# Patient Record
Sex: Female | Born: 1985 | Race: White | Hispanic: No | Marital: Married | Smoking: Never smoker
Health system: Southern US, Community
[De-identification: ages and names within clinical notes are randomized; demographics above are authoritative.]

## PROBLEM LIST (undated history)

## (undated) ENCOUNTER — Inpatient Hospital Stay: Payer: Self-pay

## (undated) DIAGNOSIS — R238 Other skin changes: Secondary | ICD-10-CM

## (undated) DIAGNOSIS — E669 Obesity, unspecified: Secondary | ICD-10-CM

## (undated) DIAGNOSIS — E079 Disorder of thyroid, unspecified: Secondary | ICD-10-CM

## (undated) DIAGNOSIS — R233 Spontaneous ecchymoses: Secondary | ICD-10-CM

## (undated) DIAGNOSIS — D649 Anemia, unspecified: Secondary | ICD-10-CM

## (undated) HISTORY — PX: OTHER SURGICAL HISTORY: SHX169

## (undated) HISTORY — DX: Obesity, unspecified: E66.9

## (undated) HISTORY — DX: Anemia, unspecified: D64.9

## (undated) HISTORY — DX: Disorder of thyroid, unspecified: E07.9

## (undated) HISTORY — DX: Spontaneous ecchymoses: R23.3

## (undated) HISTORY — DX: Other skin changes: R23.8

---

## 2006-04-02 ENCOUNTER — Ambulatory Visit: Payer: Self-pay | Admitting: Family Medicine

## 2010-02-04 ENCOUNTER — Observation Stay: Payer: Self-pay | Admitting: Obstetrics & Gynecology

## 2010-02-22 ENCOUNTER — Observation Stay: Payer: Self-pay | Admitting: Unknown Physician Specialty

## 2010-02-24 ENCOUNTER — Observation Stay: Payer: Self-pay | Admitting: Unknown Physician Specialty

## 2010-03-03 ENCOUNTER — Observation Stay: Payer: Self-pay | Admitting: Obstetrics & Gynecology

## 2010-03-12 ENCOUNTER — Inpatient Hospital Stay: Payer: Self-pay | Admitting: Obstetrics and Gynecology

## 2010-09-23 ENCOUNTER — Ambulatory Visit: Payer: Self-pay | Admitting: Family Medicine

## 2011-12-29 ENCOUNTER — Inpatient Hospital Stay: Payer: Self-pay | Admitting: Internal Medicine

## 2011-12-29 LAB — CBC WITH DIFFERENTIAL/PLATELET
Basophil %: 0.2 %
Eosinophil #: 0 10*3/uL (ref 0.0–0.7)
HCT: 32.2 % — ABNORMAL LOW (ref 35.0–47.0)
Lymphocyte #: 1.2 10*3/uL (ref 1.0–3.6)
Lymphocyte %: 11.9 %
MCV: 83 fL (ref 80–100)
Monocyte #: 0.5 x10 3/mm (ref 0.2–0.9)
Monocyte %: 4.8 %
Neutrophil %: 82.9 %
Platelet: 236 10*3/uL (ref 150–440)
RDW: 14.5 % (ref 11.5–14.5)
WBC: 10.5 10*3/uL (ref 3.6–11.0)

## 2012-10-27 ENCOUNTER — Other Ambulatory Visit: Payer: Self-pay | Admitting: General Surgery

## 2012-10-27 ENCOUNTER — Encounter: Payer: Self-pay | Admitting: General Surgery

## 2012-10-27 ENCOUNTER — Ambulatory Visit (INDEPENDENT_AMBULATORY_CARE_PROVIDER_SITE_OTHER): Payer: Managed Care, Other (non HMO) | Admitting: General Surgery

## 2012-10-27 VITALS — BP 120/74 | HR 82 | Resp 12 | Ht 66.0 in | Wt 232.0 lb

## 2012-10-27 DIAGNOSIS — R1013 Epigastric pain: Secondary | ICD-10-CM

## 2012-10-27 LAB — HEPATIC FUNCTION PANEL A (ARMC)
Albumin: 3.7 g/dL (ref 3.4–5.0)
Alkaline Phosphatase: 92 U/L (ref 50–136)
Bilirubin, Direct: <0.1 mg/dL (ref 0.00–0.20)
Bilirubin,Total: 0.3 mg/dL (ref 0.2–1.0)
SGOT(AST): 15 U/L (ref 15–37)
SGPT (ALT): 24 U/L (ref 12–78)
Total Protein: 7.6 g/dL (ref 6.4–8.2)

## 2012-10-27 LAB — CBC WITH DIFFERENTIAL/PLATELET
Basophil #: 0 10*3/uL (ref 0.0–0.1)
Eosinophil #: 0.1 10*3/uL (ref 0.0–0.7)
Eosinophil %: 1.3 %
HCT: 38.2 % (ref 35.0–47.0)
Lymphocyte %: 15.5 %
MCV: 84 fL (ref 80–100)
Monocyte %: 6 %
Neutrophil #: 5 10*3/uL (ref 1.4–6.5)
Neutrophil %: 76.7 %
Platelet: 291 10*3/uL (ref 150–440)
RBC: 4.57 10*6/uL (ref 3.80–5.20)

## 2012-10-27 NOTE — Progress Notes (Signed)
Patient ID: Tiffany Bell, female   DOB: 1986-05-27, 27 y.o.   MRN: [id number]  Chief Complaint  Patient presents with  . Pancreatitis    HPI Tiffany Bell is a 27 y.o. female who presents today for an evaluation of pancreatitis. She states she noticed upper abdominal pain that radiates to the back for approximately 2 weeks. When she lays on her left side she has pain. She has not had pain since yesterday. She states that it was something that was all of a sudden. She describes the pain as severe stabbing pain. She had  lipase done 2 days ago + 900.   HPI  Past Medical History  Diagnosis Date  . Thyroid disease     hypothyroid  . Bruises easily     Past Surgical History  Procedure Laterality Date  . Cesarean section      History reviewed. No pertinent family history.  Social History History  Substance Use Topics  . Smoking status: Never Smoker   . Smokeless tobacco: Never Used  . Alcohol Use: No    No Known Allergies  Current Outpatient Prescriptions  Medication Sig Dispense Refill  . acetaminophen (TYLENOL) 325 MG tablet Take 325 mg by mouth as needed for pain.      Marland Kitchen ibuprofen (ADVIL,MOTRIN) 200 MG tablet Take 200 mg by mouth as needed for pain.      . Norethin Ace-Eth Estrad-FE (MINASTRIN 24 FE) 1-20 MG-MCG(24) CHEW Chew 1 tablet by mouth daily.       No current facility-administered medications for this visit.    Review of Systems Review of Systems  Constitutional: Positive for appetite change and fatigue.  Respiratory: Negative.   Cardiovascular: Negative.   Gastrointestinal: Positive for abdominal pain.    Blood pressure 120/74, pulse 82, resp. rate 12, height 5\' 6"  (1.676 m), weight 232 lb (105.235 kg), last menstrual period 10/13/2012.  Physical Exam Physical Exam  Constitutional: She appears well-developed and well-nourished.  Eyes: Conjunctivae are normal. No scleral icterus.  Neck: Normal range of motion.  Cardiovascular: Normal rate, regular  rhythm and normal heart sounds.   Pulmonary/Chest: Effort normal and breath sounds normal.  Abdominal: Soft. Bowel sounds are normal. There is tenderness in the right upper quadrant.    Data Reviewed No imaging. Lipase reported 900 2 days ago.  Assessment    Pancreatitis,likely from biliary source. Her pain has subsided last 24 hrs.     Plan    Will call pt with lab results this evening       Patient to have the following labs drawn at [facility] today: CBC, hepatic function panel A, and lipase. Report will be called to the patient this evening.  This patient is to also have a limited abdominal ultrasound at [facility] on 10-28-12 at 8 am (arrive 7:45 am). Prep: NPO after midnight. Patient will wait at the hospital tomorrow until we get a call report. She is aware of all instructions.   Tiffany Bell G 10/27/2012, 8:11 PM

## 2012-10-27 NOTE — Patient Instructions (Addendum)
Patient to have the following labs drawn at [facility] today: CBC, hepatic function panel A, and lipase. Report will be called to the patient this evening.  This patient is to also have a limited abdominal ultrasound at [facility] on 10-28-12 at 8 am (arrive 7:45 am). Prep: NPO after midnight. Patient will wait at the hospital tomorrow until we get a call report. She is aware of all instructions.

## 2012-10-28 ENCOUNTER — Ambulatory Visit: Payer: Self-pay | Admitting: General Surgery

## 2012-10-28 ENCOUNTER — Ambulatory Visit (INDEPENDENT_AMBULATORY_CARE_PROVIDER_SITE_OTHER): Payer: Managed Care, Other (non HMO) | Admitting: General Surgery

## 2012-10-28 ENCOUNTER — Encounter: Payer: Self-pay | Admitting: General Surgery

## 2012-10-28 VITALS — BP 148/80 | HR 100 | Resp 20 | Ht 67.0 in | Wt 228.0 lb

## 2012-10-28 DIAGNOSIS — K859 Acute pancreatitis without necrosis or infection, unspecified: Secondary | ICD-10-CM

## 2012-10-28 LAB — CBC WITH DIFFERENTIAL/PLATELET
Basophil #: 0 10*3/uL (ref 0.0–0.1)
Basophil %: 0.7 %
Eosinophil %: 1.7 %
HCT: 37.6 % (ref 35.0–47.0)
HGB: 12.4 g/dL (ref 12.0–16.0)
Lymphocyte #: 1 10*3/uL (ref 1.0–3.6)
Lymphocyte %: 17.9 %
MCH: 27.5 pg (ref 26.0–34.0)
MCHC: 33 g/dL (ref 32.0–36.0)
Neutrophil #: 4.2 10*3/uL (ref 1.4–6.5)
RBC: 4.51 10*6/uL (ref 3.80–5.20)
RDW: 14 % (ref 11.5–14.5)
WBC: 5.7 10*3/uL (ref 3.6–11.0)

## 2012-10-28 LAB — HEPATIC FUNCTION PANEL A (ARMC)
Albumin: 3.9 g/dL (ref 3.4–5.0)
Alkaline Phosphatase: 94 U/L (ref 50–136)
Bilirubin,Total: 0.3 mg/dL (ref 0.2–1.0)
SGOT(AST): 20 U/L (ref 15–37)

## 2012-10-28 LAB — LIPASE, BLOOD: Lipase: 873 U/L — ABNORMAL HIGH (ref 73–393)

## 2012-10-28 NOTE — Patient Instructions (Addendum)
Patient to have a MRCP at [facility] on 10-29-12 at 10:30 am (arrive 10 am). Prep: NPO after midnight. She is aware of date, time, and instructions.

## 2012-10-28 NOTE — Progress Notes (Signed)
Patient to have a MRCP at [facility] on 10-29-12 at 10:30 am (arrive 10 am). Prep: NPO after midnight. She is aware of date, time, and instructions.  

## 2012-10-28 NOTE — Progress Notes (Signed)
Patient ID: Tiffany Bell, female   DOB: 1986/01/02, 26 y.o.   MRN: [id number]  Pt states her pain is resolved. Her lipase was 1600yesterday and down to 800 today. LFTs and CBC are normal. US showed no gallstones but CBD was 6.6cm . 34yrs ago was 3 mm. Above data reviewed with pt.  She may have passed a stone or may still have one in the CBD. As pt is not having any pain now will not t need hospital admission. MRCP scheduled for tomorrow. Pt advised fully.

## 2012-10-29 ENCOUNTER — Encounter: Payer: Self-pay | Admitting: General Surgery

## 2012-10-29 ENCOUNTER — Ambulatory Visit: Payer: Self-pay | Admitting: General Surgery

## 2012-10-29 DIAGNOSIS — K859 Acute pancreatitis without necrosis or infection, unspecified: Secondary | ICD-10-CM | POA: Insufficient documentation

## 2012-11-16 ENCOUNTER — Encounter: Payer: Self-pay | Admitting: General Surgery

## 2012-11-16 ENCOUNTER — Ambulatory Visit: Payer: Managed Care, Other (non HMO) | Admitting: General Surgery

## 2012-12-20 ENCOUNTER — Ambulatory Visit: Payer: Managed Care, Other (non HMO) | Admitting: General Surgery

## 2013-08-17 ENCOUNTER — Observation Stay: Payer: Self-pay | Admitting: Obstetrics and Gynecology

## 2013-08-20 ENCOUNTER — Inpatient Hospital Stay: Payer: Self-pay | Admitting: Obstetrics & Gynecology

## 2013-08-21 LAB — CBC WITH DIFFERENTIAL/PLATELET
Basophil #: 0 10*3/uL (ref 0.0–0.1)
Basophil %: 0.3 %
EOS ABS: 0.1 10*3/uL (ref 0.0–0.7)
EOS PCT: 0.7 %
HCT: 34.4 % — AB (ref 35.0–47.0)
HGB: 11.2 g/dL — AB (ref 12.0–16.0)
LYMPHS ABS: 2.2 10*3/uL (ref 1.0–3.6)
LYMPHS PCT: 20.3 %
MCH: 28.1 pg (ref 26.0–34.0)
MCHC: 32.7 g/dL (ref 32.0–36.0)
MCV: 86 fL (ref 80–100)
MONO ABS: 0.6 x10 3/mm (ref 0.2–0.9)
MONOS PCT: 5.9 %
Neutrophil #: 7.8 10*3/uL — ABNORMAL HIGH (ref 1.4–6.5)
Neutrophil %: 72.8 %
Platelet: 238 10*3/uL (ref 150–440)
RBC: 4 10*6/uL (ref 3.80–5.20)
RDW: 14.2 % (ref 11.5–14.5)
WBC: 10.8 10*3/uL (ref 3.6–11.0)

## 2013-08-22 LAB — HEMATOCRIT: HCT: 28.5 % — ABNORMAL LOW (ref 35.0–47.0)

## 2013-09-28 ENCOUNTER — Ambulatory Visit: Payer: Managed Care, Other (non HMO) | Admitting: General Surgery

## 2014-01-03 ENCOUNTER — Ambulatory Visit (INDEPENDENT_AMBULATORY_CARE_PROVIDER_SITE_OTHER): Payer: BC Managed Care – PPO | Admitting: General Surgery

## 2014-01-03 ENCOUNTER — Encounter: Payer: Self-pay | Admitting: General Surgery

## 2014-01-03 VITALS — BP 126/74 | HR 76 | Resp 14 | Ht 67.0 in | Wt 229.0 lb

## 2014-01-03 DIAGNOSIS — R1011 Right upper quadrant pain: Secondary | ICD-10-CM

## 2014-01-03 DIAGNOSIS — R1013 Epigastric pain: Secondary | ICD-10-CM

## 2014-01-03 NOTE — Progress Notes (Signed)
Patient ID: Tiffany Bell, female   DOB: 08/05/1985, 28 y.o.   MRN: [id number]  Chief Complaint  Patient presents with  . Follow-up    HPI Tiffany Bell is a 28 y.o. female.  Here for one year follow up from pancreatitis. She states she is having epigastric pain for about 2 weeks radiating to her right shoulder.  She notices the pain after she eats. The pain last about a few hours. She is also having mid back pressure. Denies any vomiting. She had another child in February 2015 and is breastfeeding.  HPI  Past Medical History  Diagnosis Date  . Thyroid disease     hypothyroid  . Bruises easily     Past Surgical History  Procedure Laterality Date  . Cesarean section      History reviewed. No pertinent family history.  Social History History  Substance Use Topics  . Smoking status: Never Smoker   . Smokeless tobacco: Never Used  . Alcohol Use: No    No Known Allergies  Current Outpatient Prescriptions  Medication Sig Dispense Refill  . acetaminophen (TYLENOL) 325 MG tablet Take 325 mg by mouth as needed for pain.      Marland Kitchen ibuprofen (ADVIL,MOTRIN) 200 MG tablet Take 200 mg by mouth as needed for pain.       No current facility-administered medications for this visit.    Review of Systems Review of Systems  Constitutional: Negative.   Respiratory: Negative.   Cardiovascular: Negative.   Gastrointestinal: Negative for nausea, vomiting, diarrhea and constipation.    Blood pressure 126/74, pulse 76, resp. rate 14, height 5\' 7"  (1.702 m), weight 229 lb (103.874 kg).  Physical Exam Physical Exam  Constitutional: She is oriented to person, place, and time. She appears well-developed and well-nourished.  Eyes: Conjunctivae are normal. No scleral icterus.  Neck: Neck supple.  Cardiovascular: Normal rate, regular rhythm and normal heart sounds.   Pulmonary/Chest: Effort normal and breath sounds normal.  Abdominal: Soft. Normal appearance. There is no  hepatosplenomegaly. There is no tenderness.  Lymphadenopathy:    She has no cervical adenopathy.  Neurological: She is alert and oriented to person, place, and time.  Skin: Skin is warm and dry.    Data Reviewed Previous notes.  Assessment    Stable physical exam. Her symptoms suggest biliary colic. Last year she had an episode of pancreatitis. [place] then showed no gallstones but a dilated CBD. MRCP was normal.It was felt she had passed a single stone.     Plan    Obtain labs and abdominal ultrasound for further evaluation.    Patient to have the following labs drawn at [facility] lab: CBC, Liver A, and Lipase.   This patient has been scheduled for an abdominal ultrasound at [facility] for 01-04-14 at 8 am (arrive 7:45 am). Prep: NPO after midnight. She is aware of all instructions and verbalizes understanding.   PCP: Dear, Isaiah Serge 01/04/2014, 7:03 AM

## 2014-01-03 NOTE — Patient Instructions (Addendum)
The patient is aware to call back for any questions or concerns.    Obtain labs and abdominal ultrasound for further evaluation.  This patient has been scheduled for an abdominal ultrasound at [facility] for 01-04-14 at 8 am (arrive 7:45 am). Prep: NPO after midnight. She is aware of all instructions and verbalizes understanding.

## 2014-01-04 ENCOUNTER — Encounter: Payer: Self-pay | Admitting: General Surgery

## 2014-01-04 ENCOUNTER — Ambulatory Visit: Payer: Self-pay | Admitting: General Surgery

## 2014-01-05 LAB — CBC WITH DIFFERENTIAL
BASOS ABS: 0 10*3/uL (ref 0.0–0.2)
Basos: 0 %
EOS ABS: 0.2 10*3/uL (ref 0.0–0.4)
Eos: 2 %
HCT: 41.4 % (ref 34.0–46.6)
HEMOGLOBIN: 13.9 g/dL (ref 11.1–15.9)
IMMATURE GRANS (ABS): 0 10*3/uL (ref 0.0–0.1)
Immature Granulocytes: 0 %
LYMPHS: 21 %
Lymphocytes Absolute: 1.5 10*3/uL (ref 0.7–3.1)
MCH: 28.1 pg (ref 26.6–33.0)
MCHC: 33.6 g/dL (ref 31.5–35.7)
MCV: 84 fL (ref 79–97)
Monocytes Absolute: 0.5 10*3/uL (ref 0.1–0.9)
Monocytes: 7 %
NEUTROS PCT: 70 %
Neutrophils Absolute: 4.8 10*3/uL (ref 1.4–7.0)
Platelets: 297 10*3/uL (ref 150–379)
RBC: 4.94 x10E6/uL (ref 3.77–5.28)
RDW: 14.8 % (ref 12.3–15.4)
WBC: 7 10*3/uL (ref 3.4–10.8)

## 2014-01-05 LAB — HEPATIC FUNCTION PANEL
ALT: 16 IU/L (ref 0–32)
AST: 17 IU/L (ref 0–40)
Albumin: 4.7 g/dL (ref 3.5–5.5)
Alkaline Phosphatase: 86 IU/L (ref 39–117)
Bilirubin, Direct: 0.14 mg/dL (ref 0.00–0.40)
TOTAL PROTEIN: 7.5 g/dL (ref 6.0–8.5)
Total Bilirubin: 0.4 mg/dL (ref 0.0–1.2)

## 2014-01-05 LAB — LIPASE: Lipase: 16 U/L (ref 0–59)

## 2014-01-08 ENCOUNTER — Encounter: Payer: Self-pay | Admitting: General Surgery

## 2014-01-08 NOTE — Progress Notes (Signed)
Patient has an appointment tomorrow, 01-09-14, to discuss options.

## 2014-01-09 ENCOUNTER — Ambulatory Visit: Payer: BC Managed Care – PPO | Admitting: General Surgery

## 2014-01-25 ENCOUNTER — Encounter: Payer: Self-pay | Admitting: *Deleted

## 2014-05-07 ENCOUNTER — Encounter: Payer: Self-pay | Admitting: General Surgery

## 2014-06-20 ENCOUNTER — Ambulatory Visit: Payer: BC Managed Care – PPO | Admitting: General Surgery

## 2014-06-26 ENCOUNTER — Ambulatory Visit (INDEPENDENT_AMBULATORY_CARE_PROVIDER_SITE_OTHER): Payer: BC Managed Care – PPO | Admitting: General Surgery

## 2014-06-26 ENCOUNTER — Encounter: Payer: Self-pay | Admitting: General Surgery

## 2014-06-26 VITALS — BP 124/72 | HR 76 | Resp 12 | Ht 67.0 in | Wt 229.0 lb

## 2014-06-26 DIAGNOSIS — R1013 Epigastric pain: Secondary | ICD-10-CM

## 2014-06-26 NOTE — Progress Notes (Signed)
Patient ID: Tiffany Bell, female   DOB: 11/22/1985, 28 y.o.   MRN: [id number]  Chief Complaint  Patient presents with  . Abdominal Pain    HPI Tiffany Bell is a 28 y.o. female here today for a evaluation of abdominal pain. This has been going on for 2 years now. The pain is located in the epigastric area and radiation to her back. Pain last from 30 min to two hours. Spicy foods make it worst.  HPI  Past Medical History  Diagnosis Date  . Thyroid disease     hypothyroid  . Bruises easily     Past Surgical History  Procedure Laterality Date  . Cesarean section      History reviewed. No pertinent family history.  Social History History  Substance Use Topics  . Smoking status: Never Smoker   . Smokeless tobacco: Never Used  . Alcohol Use: No    No Known Allergies  Current Outpatient Prescriptions  Medication Sig Dispense Refill  . acetaminophen (TYLENOL) 325 MG tablet Take 325 mg by mouth as needed for pain.    Marland Kitchen ibuprofen (ADVIL,MOTRIN) 200 MG tablet Take 200 mg by mouth as needed for pain.     No current facility-administered medications for this visit.    Review of Systems Review of Systems  Constitutional: Negative.   Respiratory: Negative.   Cardiovascular: Negative.     Blood pressure 124/72, pulse 76, resp. rate 12, height 5\' 7"  (1.702 m), weight 229 lb (103.874 kg).  Physical Exam Physical Exam  Constitutional: She is oriented to person, place, and time. She appears well-developed and well-nourished.  Eyes: Conjunctivae are normal. No scleral icterus.  Neck: Neck supple.  Cardiovascular: Normal rate, regular rhythm and normal heart sounds.   Pulmonary/Chest: Effort normal and breath sounds normal.  Abdominal: Soft. Normal appearance and bowel sounds are normal.  Lymphadenopathy:    She has no cervical adenopathy.  Neurological: She is alert and oriented to person, place, and time.  Skin: Skin is warm and dry.    Data Reviewed Prior notes and  labs/xrays  Assessment    Ongoing symptoms of ruq abd pains very suggestive of biliary colic. She had documented pancreatitis after one such episode in Apr 2014. Feel there is enough justification for removal of gallbladder.      Plan    Discussed gallbladder surgery in full. Patient  Agrees.      Patient to call back to schedule her gallbladder surgery once she has insurance information for January. She has been given surgery instructions. She will have labwork drawn at [facility] this week. She is scheduled for an ultrasound of the gallbladder at [facility] location on 07/03/14 at 10:15 am. She will arrive by 10:00 am and will have nothing to eat or drink for 8 hours prior. Patient is aware of date, time, and instructions.    SANKAR,SEEPLAPUTHUR G 06/27/2014, 5:30 AM

## 2014-06-26 NOTE — Patient Instructions (Addendum)
Laparoscopic Cholecystectomy Laparoscopic cholecystectomy is surgery to remove the gallbladder. The gallbladder is located in the upper right part of the abdomen, behind the liver. It is a storage sac for bile produced in the liver. Bile aids in the digestion and absorption of fats. Cholecystectomy is often done for inflammation of the gallbladder (cholecystitis). This condition is usually caused by a buildup of gallstones (cholelithiasis) in your gallbladder. Gallstones can block the flow of bile, resulting in inflammation and pain. In severe cases, emergency surgery may be required. When emergency surgery is not required, you will have time to prepare for the procedure. Laparoscopic surgery is an alternative to open surgery. Laparoscopic surgery has a shorter recovery time. Your common bile duct may also need to be examined during the procedure. If stones are found in the common bile duct, they may be removed. LET YOUR HEALTH CARE PROVIDER KNOW ABOUT:  Any allergies you have.  All medicines you are taking, including vitamins, herbs, eye drops, creams, and over-the-counter medicines.  Previous problems you or members of your family have had with the use of anesthetics.  Any blood disorders you have.  Previous surgeries you have had.  Medical conditions you have. RISKS AND COMPLICATIONS Generally, this is a safe procedure. However, as with any procedure, complications can occur. Possible complications include:  Infection.  Damage to the common bile duct, nerves, arteries, veins, or other internal organs such as the stomach, liver, or intestines.  Bleeding.  A stone may remain in the common bile duct.  A bile leak from the cyst duct that is clipped when your gallbladder is removed.  The need to convert to open surgery, which requires a larger incision in the abdomen. This may be necessary if your surgeon thinks it is not safe to continue with a laparoscopic procedure. BEFORE THE  PROCEDURE  Ask your health care provider about changing or stopping any regular medicines. You will need to stop taking aspirin or blood thinners at least 5 days prior to surgery.  Do not eat or drink anything after midnight the night before surgery.  Let your health care provider know if you develop a cold or other infectious problem before surgery. PROCEDURE   You will be given medicine to make you sleep through the procedure (general anesthetic). A breathing tube will be placed in your mouth.  When you are asleep, your surgeon will make several small cuts (incisions) in your abdomen.  A thin, lighted tube with a tiny camera on the end (laparoscope) is inserted through one of the small incisions. The camera on the laparoscope sends a picture to a TV screen in the operating room. This gives the surgeon a good view inside your abdomen.  A gas will be pumped into your abdomen. This expands your abdomen so that the surgeon has more room to perform the surgery.  Other tools needed for the procedure are inserted through the other incisions. The gallbladder is removed through one of the incisions.  After the removal of your gallbladder, the incisions will be closed with stitches, staples, or skin glue. AFTER THE PROCEDURE  You will be taken to a recovery area where your progress will be checked often.  You may be allowed to go home the same day if your pain is controlled and you can tolerate liquids. Document Released: 06/22/2005 Document Revised: 04/12/2013 Document Reviewed: 02/01/2013 [employer] Patient Information 2015 [employer]. This information is not intended to replace advice given to you by your health care provider. Make   sure you discuss any questions you have with your health care provider.  Patient to call back to schedule her gallbladder surgery once she has insurance information for January. She has been given surgery instructions. She will have labwork drawn at [facility] this  week. She is scheduled for an ultrasound of the gallbladder at [facility] location on 07/03/14 at 10:15 am. She will arrive by 10:00 am and will have nothing to eat or drink for 8 hours prior. Patient is aware of date, time, and instructions.

## 2014-06-27 ENCOUNTER — Encounter: Payer: Self-pay | Admitting: General Surgery

## 2014-07-19 ENCOUNTER — Telehealth: Payer: Self-pay | Admitting: *Deleted

## 2014-07-19 NOTE — Telephone Encounter (Signed)
Patient was contacted today by phone.   This patient did not have gallbladder ultrasound completed in December 2015 as scheduled. Patient states husband lost his job and they are currently covered under [employer] right now but it pays very little.  Patient was instructed that if she would like to arrange gallbladder ultrasound and gallbladder surgery in the future to give our office a call. She verbalizes understanding.

## 2014-11-13 NOTE — H&P (Signed)
L&D Evaluation:  History Expanded:  HPI 29 yo G3P2002 at [redacted]w[redacted]d gestational age by LMP consistent with 11 week ultrasound.  Obstetrical history significant for cesarean delivery with G1 followed by successful VBAC with G2.  She is attempting a TOLAC with her current pregnancy.  She was checked in clinic recently and was found to be 4 cm.  She presents with regular uterine contractions.  She notes no vaginal bleeding, no leakage of fluid.  She notes positive fetal movement.  She has no incisional pain.   Gravida 3   Term 2   PreTerm 0   Abortion 0   Living 2   Blood Type (Maternal) O positive   Group B Strep Results Maternal (Result >5wks must be treated as unknown) negative   Maternal HIV Negative   Maternal Syphilis Ab Nonreactive   Maternal Varicella Immune   Rubella Results (Maternal) nonimmune   [facility] 27-Aug-2013   Patient's Medical History 1) history of anemia, 2) obesity   Patient's Surgical History Previous C-Section  wisdome teeth extraction   Medications Pre Natal Vitamins   Allergies NKDA   Social History none   Family History Non-Contributory   ROS:  ROS All systems were reviewed.  HEENT, CNS, GI, GU, Respiratory, CV, Renal and Musculoskeletal systems were found to be normal.   Exam:  Vital Signs T 98.72F, P89-103, Inital BP 144/77, then 132/65 and 130/79,  RR 20   Urine Protein not completed   General no apparent distress   Mental Status clear   Chest clear   Heart normal sinus rhythm   Abdomen gravid, tender with contractions, incision nontender   Fetal Position ceph   Back no CVAT   Edema no edema   Reflexes 2+   Pelvic 4cm over 2 check 5 hours apart   Mebranes Intact   FHT normal rate with no decels   FHT Description 125/mod var/+accels/no decels   Ucx irregular, sometimes 3-4 times q 10 min, others once every 6 minutes   Impression:  Impression No sign of active labor, no sign of uterine rupture   Plan:  Comments -  Reactive NST - Gave preeclampsia precautions. - follow up at next scheduled appt.   Follow Up Appointment already scheduled   Electronic Signatures: Will Bonnet (MD)  (Signed 12-Feb-15 23:42)  Authored: L&D Evaluation   Last Updated: 12-Feb-15 23:42 by Will Bonnet (MD)

## 2014-11-13 NOTE — H&P (Signed)
L&D Evaluation:  History:   HPI 29 year old G2 P1001 with EDC=01/08/2012 by LMP =04/03/2011 and confirmed with an 8 week ultrasound, presented at 46 4/7 weeks with c/o contractions since 0300 this AM. Cervix has changed from 2/50% to 3+/50-75%/-2 over the last two hours. Denies VB or LOF. Hx of previous C-section for failure to descend after 3 hour pushing stage. Baby weighed 8-11 at 40 +  weeks. Patient desires VBAC, but has been scheduled for a C-section 2 July if NIL. Patient has been counseled regarding risks of VBAC and consent signed in the office. Prenatal care at [facility] also remarkable for obesity, normal one hr GTTs, and a normal anatomy scan. Labs: O POS, RI, VI, GBS negative.    Presents with contractions    Patient's Medical History Anemia, Obesity    Patient's Surgical History Previous C-Section    Medications Pre Natal Vitamins    Allergies NKDA    Social History none    Family History Non-Contributory   ROS:   ROS see HPI   Exam:   Vital Signs stable  126/65, 121/65     Urine Protein trace    General no apparent distress, anxious    Mental Status clear    Chest clear    Heart normal sinus rhythm, no murmur/gallop/rubs    Abdomen gravid, non-tender    Estimated Fetal Weight Average for gestational age    Fetal Position cephalic    Edema 1+    Reflexes 1+    Pelvic no external lesions, 3/50-75%/-2    Mebranes Intact    FHT normal rate with no decels, 135 with accels to 160s, mod variability    Ucx irregular, palpate mild    Skin dry   Impression:   Impression IUP at 38 4/7 weeks with ?early labor. Prior C-section desires VBAC.   Plan:   Plan EFM/NST, monitor contractions and for cervical change   Electronic Signatures: Karene Fry (CNM)  (Signed 25-Jun-13 07:42)  Authored: L&D Evaluation   Last Updated: 25-Jun-13 07:42 by Karene Fry (CNM)

## 2014-11-13 NOTE — H&P (Signed)
L&D Evaluation:  History Expanded:  HPI 29 yo G3P2002 at 22W 1d gestational age by LMP consistent with 11 week ultrasound.  Obstetrical history significant for cesarean delivery with G1 followed by successful VBAC with G2.  She is attempting a TOLAC with her current pregnancy.  She was checked in clinic recently and was found to be 4 cm.  She presents with regular uterine contractions.  She notes no vaginal bleeding, no leakage of fluid.  She notes positive fetal movement.  She has no incisional pain.SHe is GBS NEG, RNI, VI, O POS, did not get tdap as could be found in the records.   Gravida 3   Term 2   PreTerm 0   Abortion 0   Living 2   Blood Type (Maternal) O positive   Group B Strep Results Maternal (Result >5wks must be treated as unknown) negative   Maternal HIV Negative   Maternal Syphilis Ab Nonreactive   Maternal Varicella Immune   Rubella Results (Maternal) nonimmune   Maternal T-Dap Nonimmune   [facility] 27-Aug-2013   Presents with contractions   Patient's Medical History 1) history of anemia, 2) obesity   Patient's Surgical History Previous C-Section  wisdome teeth extraction   Medications Pre Natal Vitamins   Allergies NKDA   Social History none   Family History Non-Contributory   Current Prenatal Course Notable For Vaginal Birth after Cesarean   ROS:  ROS All systems were reviewed.  HEENT, CNS, GI, GU, Respiratory, CV, Renal and Musculoskeletal systems were found to be normal.   Exam:  Vital Signs T 98.71F, P89-103, Inital BP 144/77, then 132/65 and 130/79,  RR 20   Urine Protein not completed   General no apparent distress   Mental Status clear   Chest clear   Heart normal sinus rhythm   Abdomen gravid, tender with contractions, incision nontender   Fetal Position ceph   Back no CVAT   Edema no edema   Reflexes 2+   Pelvic 5-6 cm/80% per nurse check.   Mebranes Intact   FHT normal rate with no decels   FHT Description mod  var/+accels/no decels   Fetal Heart Rate 140   Ucx irregular, sometimes 3-4 times q 10 min, others once every 6 minutes   Skin dry   Lymph no lymphadenopathy   Impression:  Impression no sign of uterine rupture   Plan:  Plan EFM/NST, monitor contractions and for cervical change   Comments - Reactive NST admit for labor activate VBAC protocol.   Follow Up Appointment already scheduled. in 6 weeks   Electronic Signatures: Erik Obey (MD)  (Signed 16-Feb-15 00:46)  Authored: L&D Evaluation   Last Updated: 16-Feb-15 00:46 by Erik Obey (MD)

## 2015-01-09 ENCOUNTER — Encounter: Payer: Self-pay | Admitting: General Surgery

## 2015-01-09 NOTE — Progress Notes (Deleted)
Patient ID: Tiffany Bell, female   DOB: [birth year], 29 y.o.   MRN: [id number]  Chief Complaint  Patient presents with  . Abdominal Pain    HPI Tiffany Bell is a 29 y.o. female. Here today for evaluation of abdominal pain. She was seen in December for gall bladder issues but had insurance complications.  The pain has been going on for about 2 years now. The pain is located in the epigastric area and radiation to her back. Pain last from 30 min to two hours. Spicy foods make it worst.     HPI  Past Medical History  Diagnosis Date  . Thyroid disease     hypothyroid  . Bruises easily     Past Surgical History  Procedure Laterality Date  . Cesarean section      No family history on file.  Social History History  Substance Use Topics  . Smoking status: Never Smoker   . Smokeless tobacco: Never Used  . Alcohol Use: No    No Known Allergies  Current Outpatient Prescriptions  Medication Sig Dispense Refill  . acetaminophen (TYLENOL) 325 MG tablet Take 325 mg by mouth as needed for pain.    Marland Kitchen ibuprofen (ADVIL,MOTRIN) 200 MG tablet Take 200 mg by mouth as needed for pain.     No current facility-administered medications for this visit.    Review of Systems Review of Systems  Constitutional: Negative.   Respiratory: Negative.   Cardiovascular: Negative.   Gastrointestinal: Positive for abdominal pain.    There were no vitals taken for this visit.  Physical Exam Physical Exam  Data Reviewed ***  Assessment    ***    Plan    ***     PCP:  No Pcp Per Patient  Carson Myrtle 01/09/2015, 1:57 PM    This encounter was created in error - please disregard.

## 2015-01-10 NOTE — Progress Notes (Signed)
error 

## 2015-01-21 ENCOUNTER — Ambulatory Visit: Payer: Self-pay | Admitting: General Surgery

## 2015-01-23 IMAGING — US ABDOMEN ULTRASOUND LIMITED
1 series · 14 of 25 positions shown · non-contrast
Comparison: 10/28/2012.

CLINICAL DATA: Right upper quadrant pain, history of pancreatitis

EXAM:
US ABDOMEN LIMITED - RIGHT UPPER QUADRANT

[Series 1: abdomen ultrasound limited · 0.22mm/px · 14 of 51 slices shown]
[im 1/51]
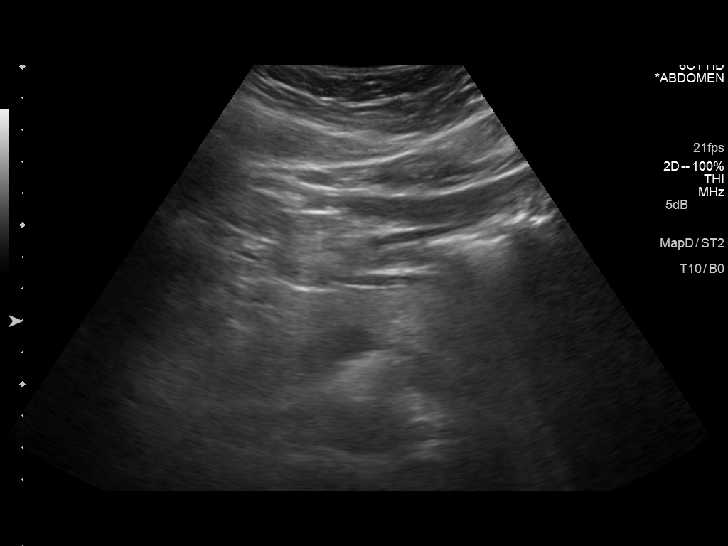
[im 5/51]
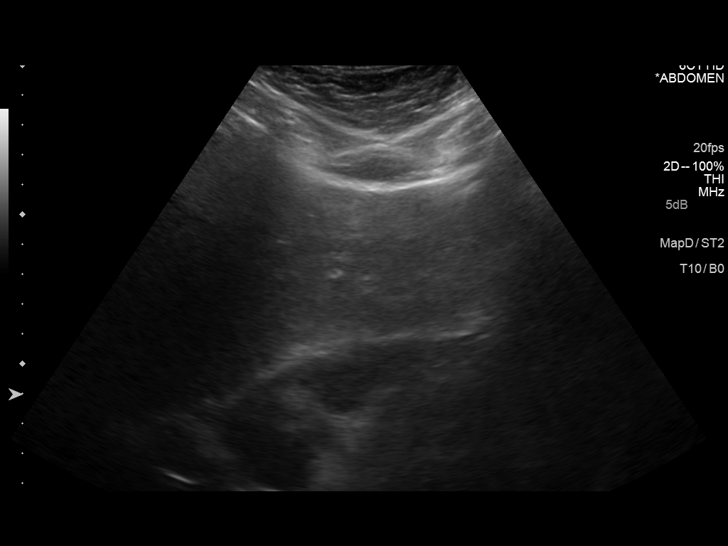
[im 9/51]
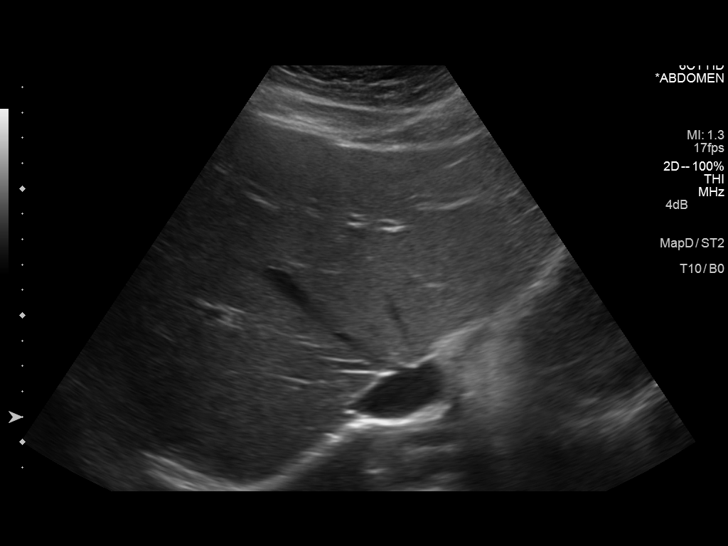
[im 13/51]
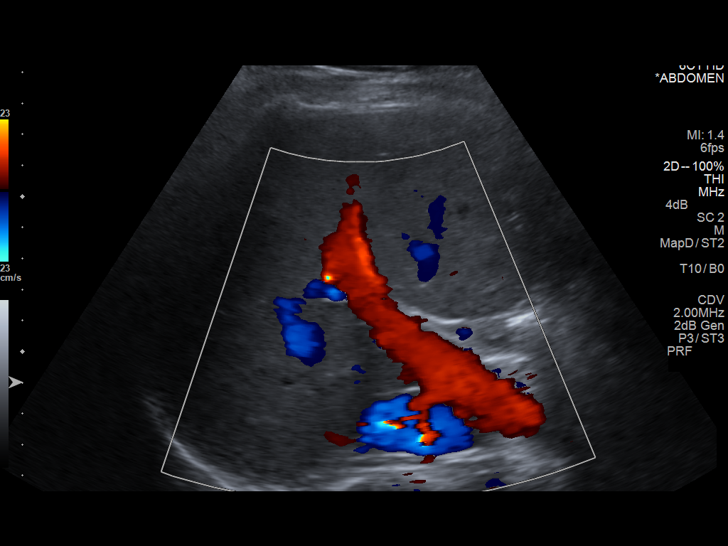
[im 17/51]
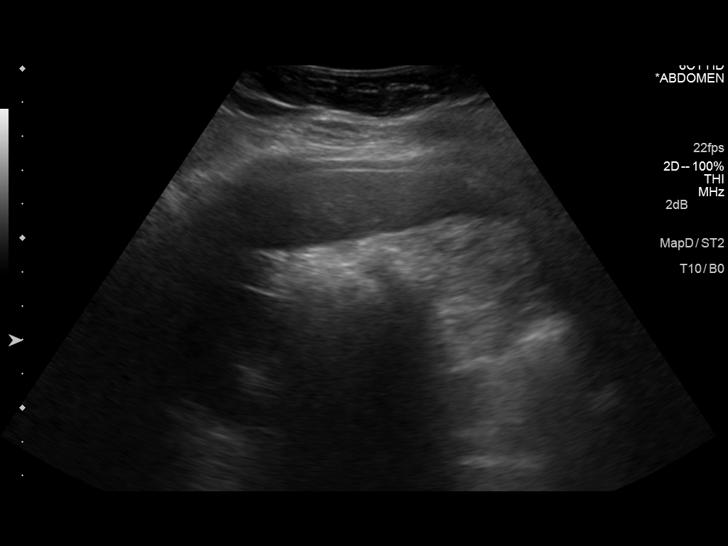
[im 19/51]
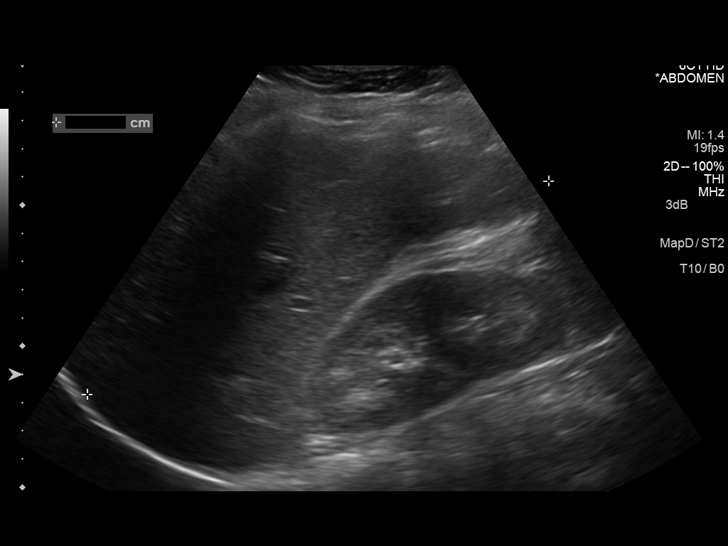
[im 23/51]
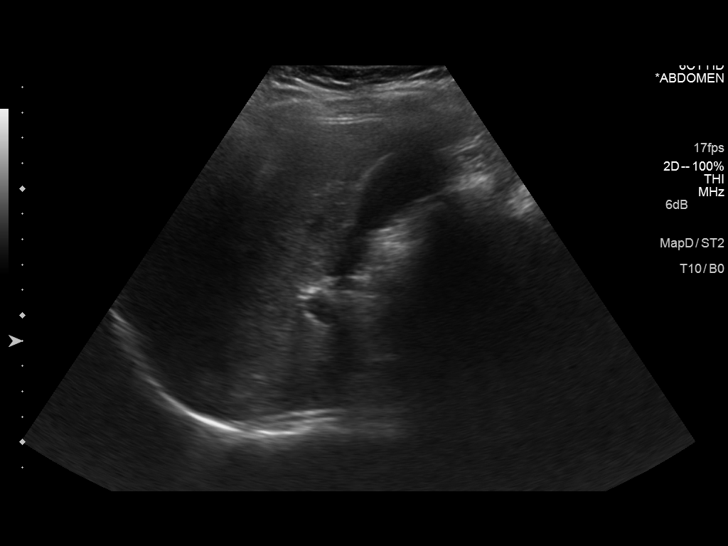
[im 28/51]
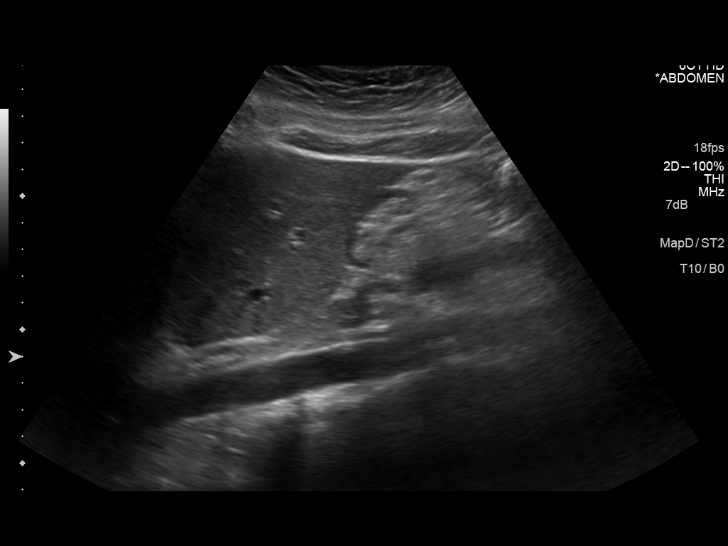
[im 32/51]
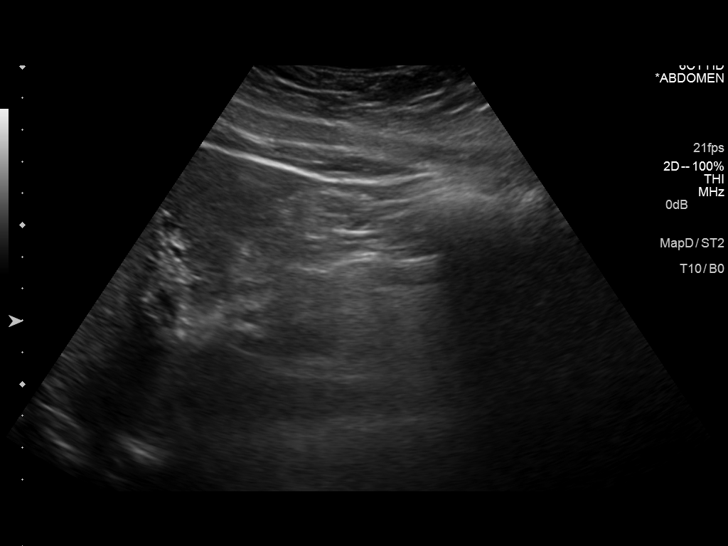
[im 34/51]
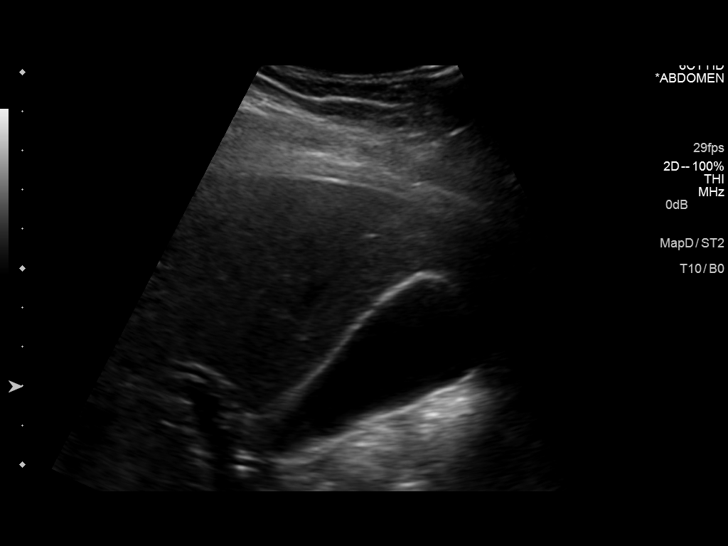
[im 38/51]
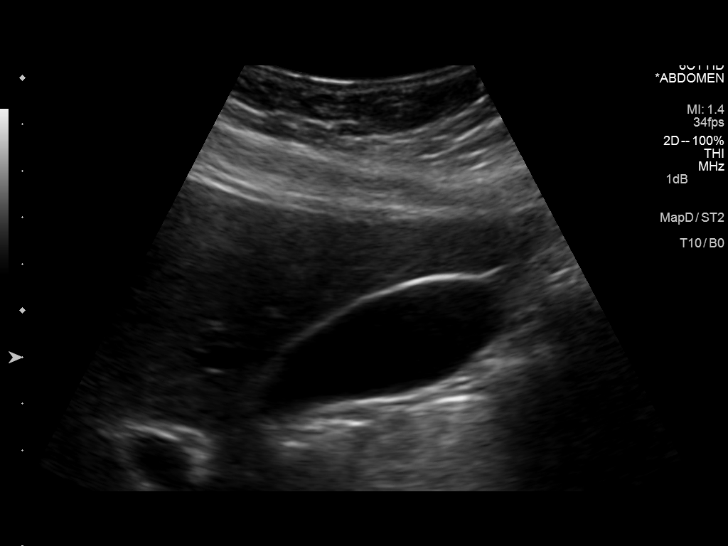
[im 42/51]
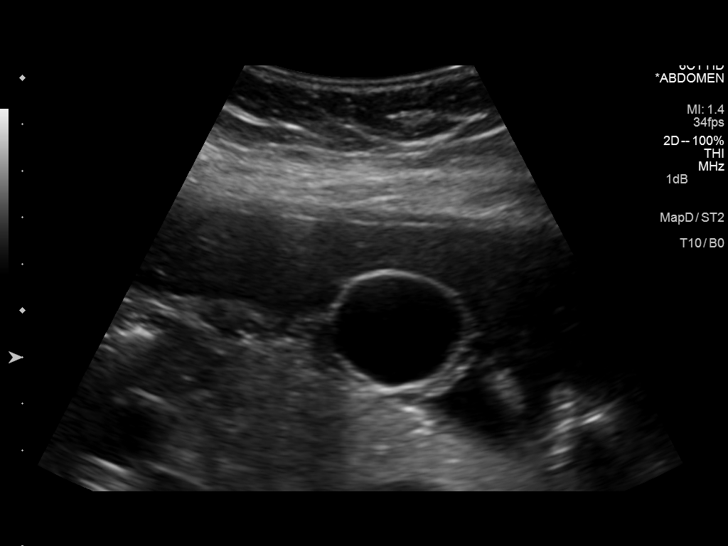
[im 46/51]
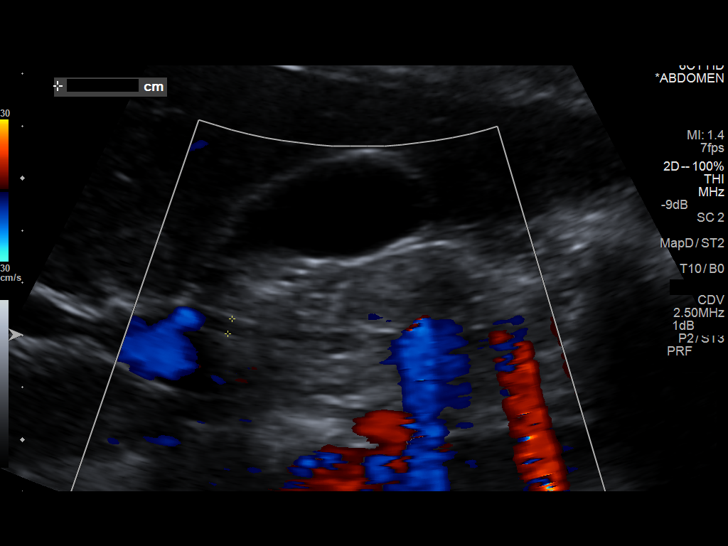
[im 51/51]
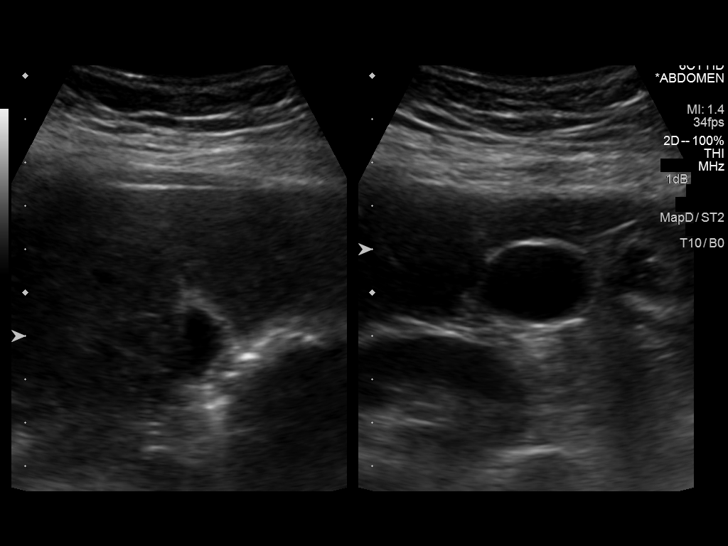

[14 of 25 positions shown; findings below may reference images not displayed]

FINDINGS: Gallbladder:

No gallstones or wall thickening visualized. No sonographic Murphy
sign noted.

Common bile duct:

Diameter: 3.7 mm in diameter within normal limits

Liver:

No focal lesion identified. Within normal limits in parenchymal
echogenicity.
IMPRESSION: Normal right upper quadrant ultrasound.

## 2015-01-24 ENCOUNTER — Encounter: Payer: Self-pay | Admitting: *Deleted

## 2015-01-24 ENCOUNTER — Emergency Department
Admission: EM | Admit: 2015-01-24 | Discharge: 2015-01-24 | Disposition: A | Discharge status: Home / Self Care | Payer: Medicaid Other | Attending: Emergency Medicine | Admitting: Emergency Medicine

## 2015-01-24 ENCOUNTER — Emergency Department: Payer: Medicaid Other

## 2015-01-24 DIAGNOSIS — R10811 Right upper quadrant abdominal tenderness: Secondary | ICD-10-CM

## 2015-01-24 DIAGNOSIS — Z3202 Encounter for pregnancy test, result negative: Secondary | ICD-10-CM | POA: Insufficient documentation

## 2015-01-24 DIAGNOSIS — R109 Unspecified abdominal pain: Secondary | ICD-10-CM | POA: Diagnosis present

## 2015-01-24 DIAGNOSIS — Z79899 Other long term (current) drug therapy: Secondary | ICD-10-CM | POA: Diagnosis not present

## 2015-01-24 DIAGNOSIS — K219 Gastro-esophageal reflux disease without esophagitis: Secondary | ICD-10-CM | POA: Diagnosis not present

## 2015-01-24 LAB — CBC WITH DIFFERENTIAL/PLATELET
BASOS ABS: 0 10*3/uL (ref 0–0.1)
Basophils Relative: 1 %
EOS PCT: 1 %
Eosinophils Absolute: 0.1 10*3/uL (ref 0–0.7)
HCT: 38.9 % (ref 35.0–47.0)
Hemoglobin: 12.8 g/dL (ref 12.0–16.0)
LYMPHS ABS: 1.7 10*3/uL (ref 1.0–3.6)
Lymphocytes Relative: 20 %
MCH: 28.5 pg (ref 26.0–34.0)
MCHC: 33 g/dL (ref 32.0–36.0)
MCV: 86.5 fL (ref 80.0–100.0)
Monocytes Absolute: 0.4 10*3/uL (ref 0.2–0.9)
Monocytes Relative: 5 %
NEUTROS ABS: 6.2 10*3/uL (ref 1.4–6.5)
NEUTROS PCT: 73 %
Platelets: 254 10*3/uL (ref 150–440)
RBC: 4.5 MIL/uL (ref 3.80–5.20)
RDW: 14 % (ref 11.5–14.5)
WBC: 8.6 10*3/uL (ref 3.6–11.0)

## 2015-01-24 LAB — COMPREHENSIVE METABOLIC PANEL
ALK PHOS: 60 U/L (ref 38–126)
ALT: 16 U/L (ref 14–54)
ANION GAP: 9 (ref 5–15)
AST: 17 U/L (ref 15–41)
Albumin: 4.4 g/dL (ref 3.5–5.0)
BILIRUBIN TOTAL: 0.2 mg/dL — AB (ref 0.3–1.2)
BUN: 15 mg/dL (ref 6–20)
CHLORIDE: 103 mmol/L (ref 101–111)
CO2: 24 mmol/L (ref 22–32)
Calcium: 9.1 mg/dL (ref 8.9–10.3)
Creatinine, Ser: 0.81 mg/dL (ref 0.44–1.00)
Glucose, Bld: 86 mg/dL (ref 65–99)
POTASSIUM: 3.8 mmol/L (ref 3.5–5.1)
Sodium: 136 mmol/L (ref 135–145)
Total Protein: 7.5 g/dL (ref 6.5–8.1)

## 2015-01-24 LAB — URINALYSIS COMPLETE WITH MICROSCOPIC (ARMC ONLY)
BILIRUBIN URINE: NEGATIVE
Bacteria, UA: NONE SEEN
GLUCOSE, UA: NEGATIVE mg/dL
Hgb urine dipstick: NEGATIVE
Ketones, ur: NEGATIVE mg/dL
LEUKOCYTES UA: NEGATIVE
Nitrite: NEGATIVE
PROTEIN: NEGATIVE mg/dL
RBC / HPF: NONE SEEN RBC/hpf (ref 0–5)
Specific Gravity, Urine: 1.023 (ref 1.005–1.030)
pH: 6 (ref 5.0–8.0)

## 2015-01-24 LAB — LIPASE, BLOOD: Lipase: 19 U/L — ABNORMAL LOW (ref 22–51)

## 2015-01-24 LAB — POCT PREGNANCY, URINE: Preg Test, Ur: NEGATIVE

## 2015-01-24 MED ORDER — FAMOTIDINE 40 MG PO TABS
40.0000 mg | ORAL_TABLET | Freq: Once | ORAL | Status: AC
  Administered 2015-01-24: 40 mg via ORAL
  Filled 2015-01-24: qty 1

## 2015-01-24 MED ORDER — RANITIDINE HCL 150 MG PO CAPS: 150.0000 mg | ORAL_CAPSULE | Freq: Two times a day (BID) | ORAL | Status: DC

## 2015-01-24 MED ORDER — GI COCKTAIL ~~LOC~~
30.0000 mL | ORAL | Status: AC
  Administered 2015-01-24: 30 mL via ORAL
  Filled 2015-01-24: qty 30

## 2015-01-24 NOTE — ED Notes (Signed)
Pt has upper abd pain   Sudden onset x 1 hour with nausea.  Pt states i have pancreatitis and gallbladder trouble.

## 2015-01-24 NOTE — ED Notes (Signed)
States pain is mainly under left breast area. Dr Joni Fears aware.

## 2015-01-24 NOTE — ED Provider Notes (Signed)
[facility] Emergency Department Provider Note  ____________________________________________  Time seen: 4:00 PM  I have reviewed the triage vital signs and the nursing notes.   HISTORY  Chief Complaint Abdominal Pain    HPI Tiffany Bell is a 29 y.o. female who complains of right upper quadrant and epigastric abdominal pain that started about 2:30 PM today. She ate lunch around 12:30 or 1 PM. She reports frequent symptoms of postprandial pain and being under the care of a surgeon to plan elective gallbladder removal. No fevers or chills nausea vomiting or diarrhea. The pain is sharp and stabbing and nonradiating. Severe in intensity. No other aggravating or alleviating factors or associated symptoms.     Past Medical History  Diagnosis Date  . Thyroid disease     hypothyroid  . Bruises easily     Patient Active Problem List   Diagnosis Date Noted  . Abdominal pain, epigastric 10/27/2012    Past Surgical History  Procedure Laterality Date  . Cesarean section      Current Outpatient Rx  Name  Route  Sig  Dispense  Refill  . acetaminophen (TYLENOL) 325 MG tablet   Oral   Take 325 mg by mouth as needed for pain.         Marland Kitchen ibuprofen (ADVIL,MOTRIN) 200 MG tablet   Oral   Take 200 mg by mouth as needed for pain.         . ranitidine (ZANTAC) 150 MG capsule   Oral   Take 1 capsule (150 mg total) by mouth 2 (two) times daily.   28 capsule   0     Allergies Review of patient's allergies indicates no known allergies.  No family history on file.  Social History History  Substance Use Topics  . Smoking status: Never Smoker   . Smokeless tobacco: Never Used  . Alcohol Use: No    Review of Systems  Constitutional: No fever or chills. No weight changes Eyes:No blurry vision or double vision.  ENT: No sore throat. Cardiovascular: No chest pain. Respiratory: No dyspnea or cough. Gastrointestinal: Abdominal pain as above.  No  BRBPR or melena. Genitourinary: Negative for dysuria, urinary retention, bloody urine, or difficulty urinating. Musculoskeletal: Negative for back pain. No joint swelling or pain. Skin: Negative for rash. Neurological: Negative for headaches, focal weakness or numbness. Psychiatric:No anxiety or depression.   Endocrine:No hot/cold intolerance, changes in energy, or sleep difficulty.  10-point ROS otherwise negative.  ____________________________________________   PHYSICAL EXAM:  VITAL SIGNS: ED Triage Vitals  Enc Vitals Group     BP 01/24/15 1529 158/92 mmHg     Pulse Rate 01/24/15 1529 96     Resp 01/24/15 1529 18     Temp 01/24/15 1529 98.2 F (36.8 C)     Temp Source 01/24/15 1529 Oral     SpO2 01/24/15 1529 100 %     Weight 01/24/15 1529 242 lb (109.77 kg)     Height 01/24/15 1529 5\' 7"  (1.702 m)     Head Cir --      Peak Flow --      Pain Score 01/24/15 1537 7     Pain Loc --      Pain Edu? --      Excl. in [place]? --      Constitutional: Alert and oriented. Well appearing and in no distress. Eyes: No scleral icterus. No conjunctival pallor. PERRL. EOMI ENT   Head: Normocephalic and atraumatic.   Nose:  No congestion/rhinnorhea. No septal hematoma   Mouth/Throat: MMM, no pharyngeal erythema. No peritonsillar mass. No uvula shift.   Neck: No stridor. No SubQ emphysema. No meningismus. Hematological/Lymphatic/Immunilogical: No cervical lymphadenopathy. Cardiovascular: RRR. Normal and symmetric distal pulses are present in all extremities. No murmurs, rubs, or gallops. Respiratory: Normal respiratory effort without tachypnea nor retractions. Breath sounds are clear and equal bilaterally. No wheezes/rales/rhonchi. Gastrointestinal: Epigastric and right upper quadrant tenderness. Questionable positive Murphy sign.. No distention. There is no CVA tenderness.  No rebound, rigidity, or guarding. Genitourinary: deferred Musculoskeletal: Nontender with normal range  of motion in all extremities. No joint effusions.  No lower extremity tenderness.  No edema. Neurologic:   Normal speech and language.  CN 2-10 normal. Motor grossly intact. No pronator drift.  Normal gait. No gross focal neurologic deficits are appreciated.  Skin:  Skin is warm, dry and intact. No rash noted.  No petechiae, purpura, or bullae. Psychiatric: Mood and affect are normal. Speech and behavior are normal. Patient exhibits appropriate insight and judgment.  ____________________________________________    LABS (pertinent positives/negatives) (all labs ordered are listed, but only abnormal results are displayed) Labs Reviewed  URINALYSIS COMPLETEWITH MICROSCOPIC ([place]) - Abnormal; Notable for the following:    Color, Urine YELLOW (*)    APPearance CLEAR (*)    Squamous Epithelial / LPF 0-5 (*)    All other components within normal limits  COMPREHENSIVE METABOLIC PANEL - Abnormal; Notable for the following:    Total Bilirubin 0.2 (*)    All other components within normal limits  LIPASE, BLOOD - Abnormal; Notable for the following:    Lipase 19 (*)    All other components within normal limits  CBC WITH DIFFERENTIAL/PLATELET  POC URINE PREG, ED  POCT PREGNANCY, URINE   ____________________________________________   EKG    ____________________________________________    RADIOLOGY  Ultrasound gallbladder unremarkable  ____________________________________________   PROCEDURES  ____________________________________________   INITIAL IMPRESSION / ASSESSMENT AND PLAN / ED COURSE  Pertinent labs & imaging results that were available during my care of the patient were reviewed by me and considered in my medical decision making (see chart for details).  Symptoms and exam concerning for cholecystitis. We'll check labs and ultrasound of the gallbladder.  ----------------------------------------- 6:00 PM on  01/24/2015 -----------------------------------------  Workup negative. Patient now reporting that since lying on her left lateral side for the ultrasound study, she is having pain in the left upper quadrant. She also notes that unlike prior gallbladder issues, this is not referring to her right shoulder. I counseled her that with negative workup in fact that she is nontoxic well-appearing no acute distress with unremarkable vital signs, most likely explanation is gastritis and GERD. We'll do a trial of Zantac and have her follow-up with her doctors. She is followed by Dr. Jamal Collin of surgery  ____________________________________________   FINAL CLINICAL IMPRESSION(S) / ED DIAGNOSES  Final diagnoses:  RUQ abdominal tenderness  Gastroesophageal reflux disease, esophagitis presence not specified      Carrie Mew, MD 01/24/15 1801

## 2015-01-24 NOTE — Discharge Instructions (Signed)
Abdominal Pain Many things can cause abdominal pain. Usually, abdominal pain is not caused by a disease and will improve without treatment. It can often be observed and treated at home. Your health care provider will do a physical exam and possibly order blood tests and X-rays to help determine the seriousness of your pain. However, in many cases, more time must pass before a clear cause of the pain can be found. Before that point, your health care provider may not know if you need more testing or further treatment. HOME CARE INSTRUCTIONS  Monitor your abdominal pain for any changes. The following actions may help to alleviate any discomfort you are experiencing:  Only take over-the-counter or prescription medicines as directed by your health care provider.  Do not take laxatives unless directed to do so by your health care provider.  Try a clear liquid diet (broth, tea, or water) as directed by your health care provider. Slowly move to a bland diet as tolerated. SEEK MEDICAL CARE IF:  You have unexplained abdominal pain.  You have abdominal pain associated with nausea or diarrhea.  You have pain when you urinate or have a bowel movement.  You experience abdominal pain that wakes you in the night.  You have abdominal pain that is worsened or improved by eating food.  You have abdominal pain that is worsened with eating fatty foods.  You have a fever. SEEK IMMEDIATE MEDICAL CARE IF:   Your pain does not go away within 2 hours.  You keep throwing up (vomiting).  Your pain is felt only in portions of the abdomen, such as the right side or the left lower portion of the abdomen.  You pass bloody or black tarry stools. MAKE SURE YOU:  Understand these instructions.   Will watch your condition.   Will get help right away if you are not doing well or get worse.  Document Released: 04/01/2005 Document Revised: 06/27/2013 Document Reviewed: 03/01/2013 [facility] Patient Information  2015 [place], [place]. This information is not intended to replace advice given to you by your health care provider. Make sure you discuss any questions you have with your health care provider.  Gastroesophageal Reflux Disease, Adult Gastroesophageal reflux disease (GERD) happens when acid from your stomach flows up into the esophagus. When acid comes in contact with the esophagus, the acid causes soreness (inflammation) in the esophagus. Over time, GERD may create small holes (ulcers) in the lining of the esophagus. CAUSES   Increased body weight. This puts pressure on the stomach, making acid rise from the stomach into the esophagus.  Smoking. This increases acid production in the stomach.  Drinking alcohol. This causes decreased pressure in the lower esophageal sphincter (valve or ring of muscle between the esophagus and stomach), allowing acid from the stomach into the esophagus.  Late evening meals and a full stomach. This increases pressure and acid production in the stomach.  A malformed lower esophageal sphincter. Sometimes, no cause is found. SYMPTOMS   Burning pain in the lower part of the mid-chest behind the breastbone and in the mid-stomach area. This may occur twice a week or more often.  Trouble swallowing.  Sore throat.  Dry cough.  Asthma-like symptoms including chest tightness, shortness of breath, or wheezing. DIAGNOSIS  Your caregiver may be able to diagnose GERD based on your symptoms. In some cases, X-rays and other tests may be done to check for complications or to check the condition of your stomach and esophagus. TREATMENT  Your caregiver may  recommend over-the-counter or prescription medicines to help decrease acid production. Ask your caregiver before starting or adding any new medicines.  HOME CARE INSTRUCTIONS   Change the factors that you can control. Ask your caregiver for guidance concerning weight loss, quitting smoking, and alcohol  consumption.  Avoid foods and drinks that make your symptoms worse, such as:  Caffeine or alcoholic drinks.  Chocolate.  Peppermint or mint flavorings.  Garlic and onions.  Spicy foods.  Citrus fruits, such as oranges, lemons, or limes.  Tomato-based foods such as sauce, chili, salsa, and pizza.  Fried and fatty foods.  Avoid lying down for the 3 hours prior to your bedtime or prior to taking a nap.  Eat small, frequent meals instead of large meals.  Wear loose-fitting clothing. Do not wear anything tight around your waist that causes pressure on your stomach.  Raise the head of your bed 6 to 8 inches with wood blocks to help you sleep. Extra pillows will not help.  Only take over-the-counter or prescription medicines for pain, discomfort, or fever as directed by your caregiver.  Do not take aspirin, ibuprofen, or other nonsteroidal anti-inflammatory drugs (NSAIDs). SEEK IMMEDIATE MEDICAL CARE IF:   You have pain in your arms, neck, jaw, teeth, or back.  Your pain increases or changes in intensity or duration.  You develop nausea, vomiting, or sweating (diaphoresis).  You develop shortness of breath, or you faint.  Your vomit is green, yellow, black, or looks like coffee grounds or blood.  Your stool is red, bloody, or black. These symptoms could be signs of other problems, such as heart disease, gastric bleeding, or esophageal bleeding. MAKE SURE YOU:   Understand these instructions.  Will watch your condition.  Will get help right away if you are not doing well or get worse. Document Released: 04/01/2005 Document Revised: 09/14/2011 Document Reviewed: 01/09/2011 [facility] Patient Information 2015 [place], [place]. This information is not intended to replace advice given to you by your health care provider. Make sure you discuss any questions you have with your health care provider.

## 2015-02-07 ENCOUNTER — Encounter: Payer: Self-pay | Admitting: *Deleted

## 2015-07-07 NOTE — L&D Delivery Note (Signed)
Delivery Note Primary OB: [facility] Delivery Physician: Barnett Applebaum, MD Gestational Age: Full term Antepartum complications: post-term and prior cesarean and obesity Intrapartum complications: None  A viable Female was delivered via vertex perentation.  Apgars:9 ,9  Weight:  10 lb 0 oz .   Placenta status: spontaneous and Intact.  Cord: 3+ vessels;  with the following complications: none.  Anesthesia:  epidural Episiotomy:  none Lacerations:  none Suture Repair: none Est. Blood Loss (mL):  less than 100 mL  Mom to postpartum.  Baby to Couplet care / Skin to Skin.  Barnett Applebaum, MD Dept of OB/GYN [phone]

## 2015-09-20 IMAGING — US US ABDOMEN LIMITED
1 series · 14 of 25 positions shown · non-contrast
Comparison: 01/04/2014.

CLINICAL DATA: Intermittent right upper quadrant abdominal pain.
Sharp pain at that location today. History of pancreatitis.

EXAM:
US ABDOMEN LIMITED - RIGHT UPPER QUADRANT

[Series 1: us abdomen limited · 0.20mm/px · 14 of 49 slices shown]
[im 1/49]
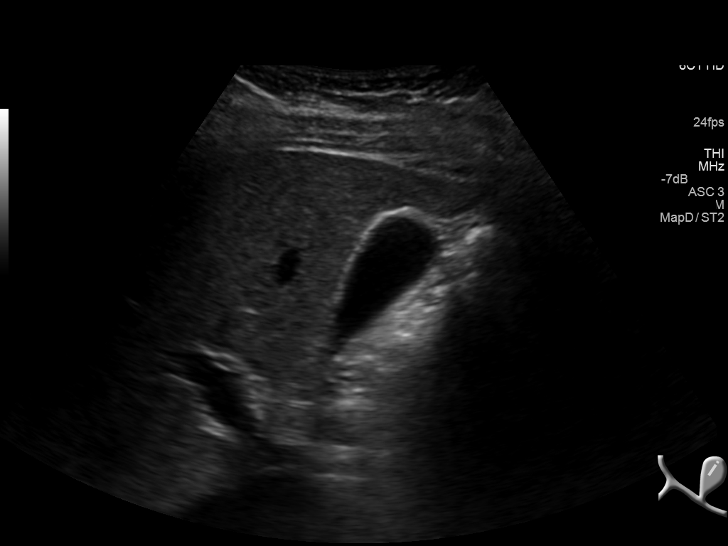
[im 5/49]
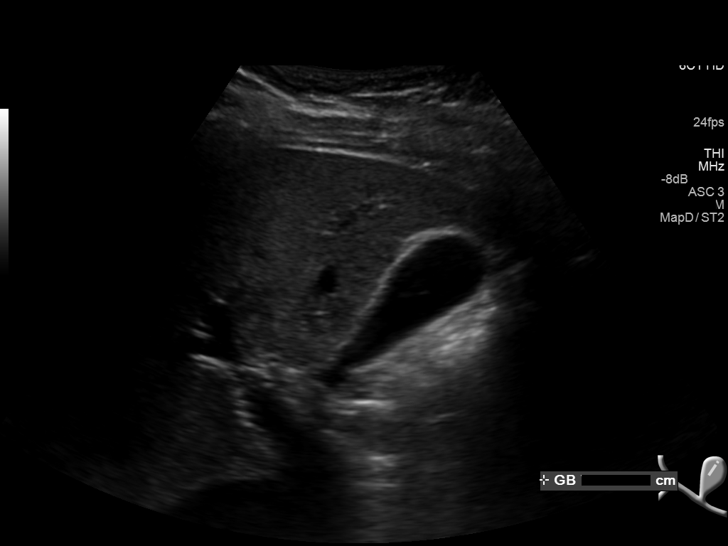
[im 9/49]
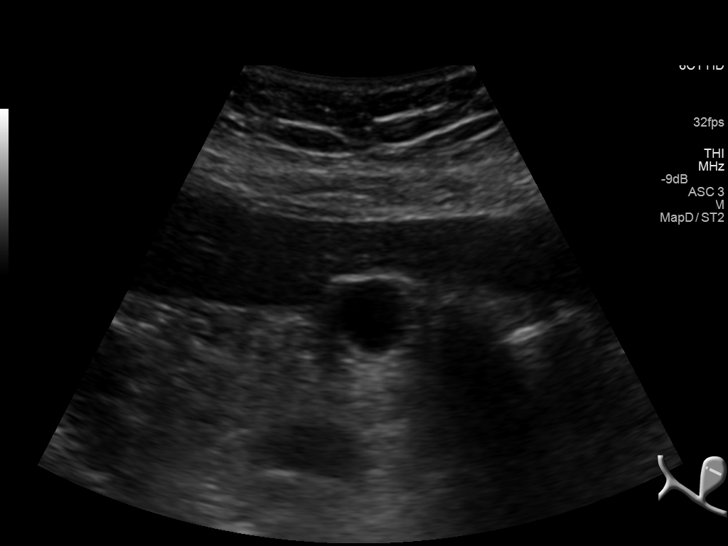
[im 13/49]
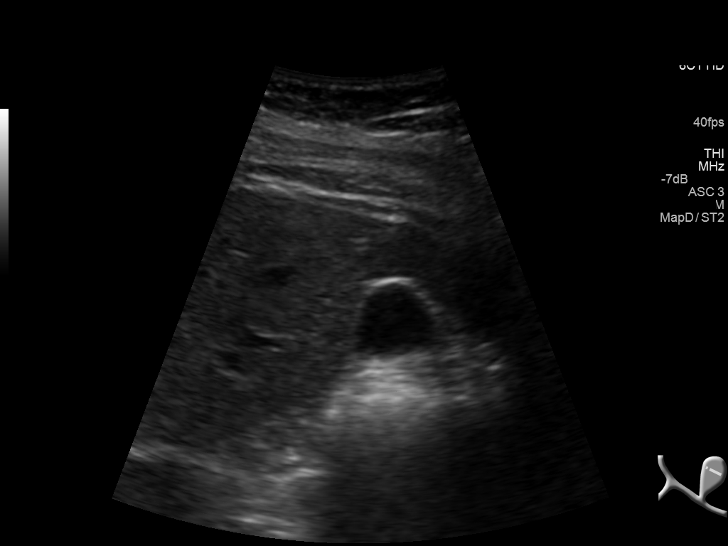
[im 17/49]
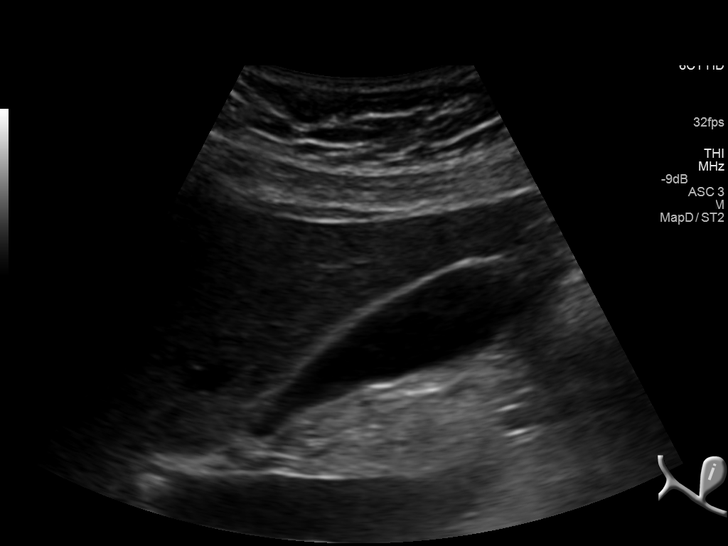
[im 19/49]
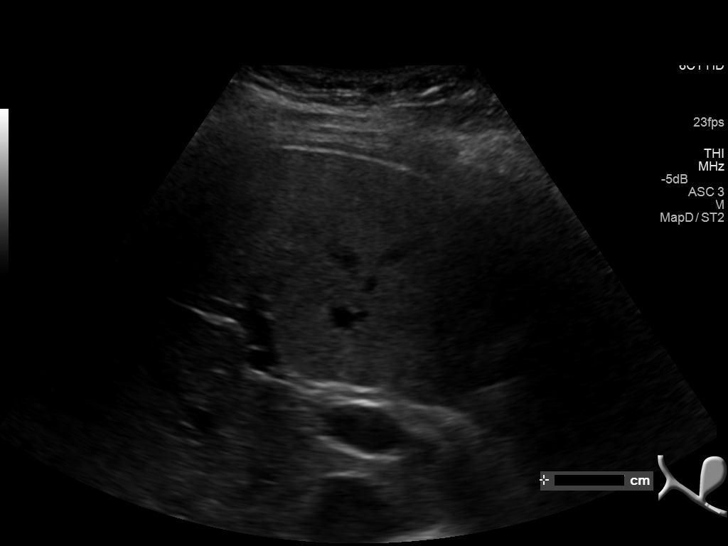
[im 23/49]
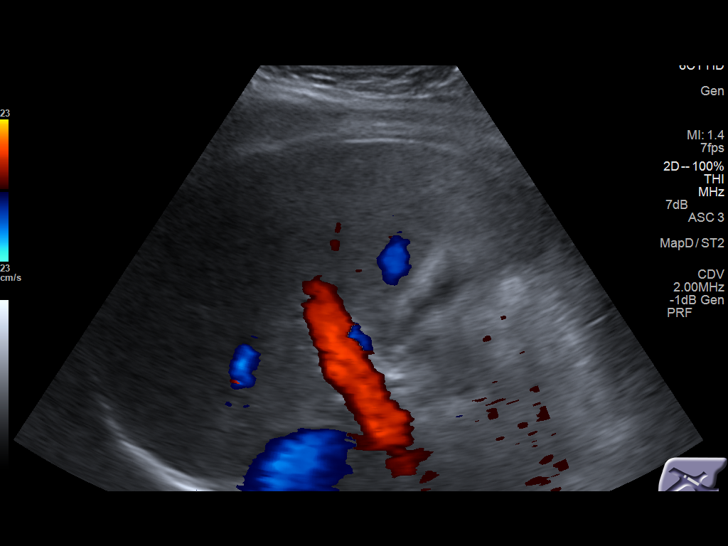
[im 27/49]
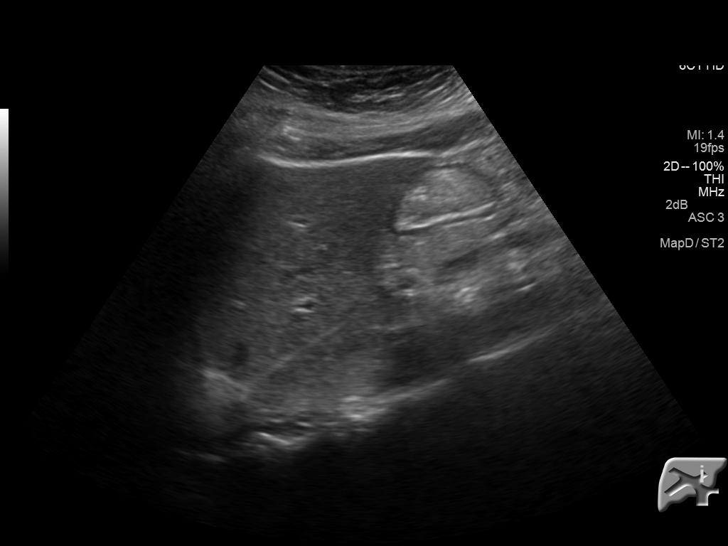
[im 31/49]
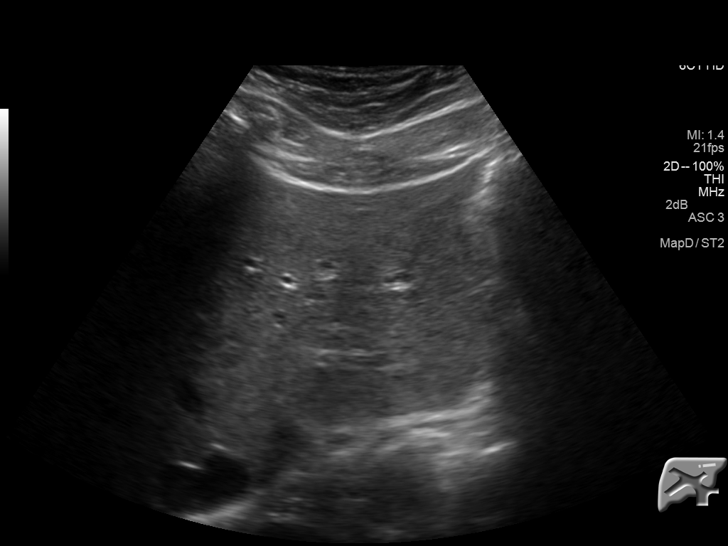
[im 33/49]
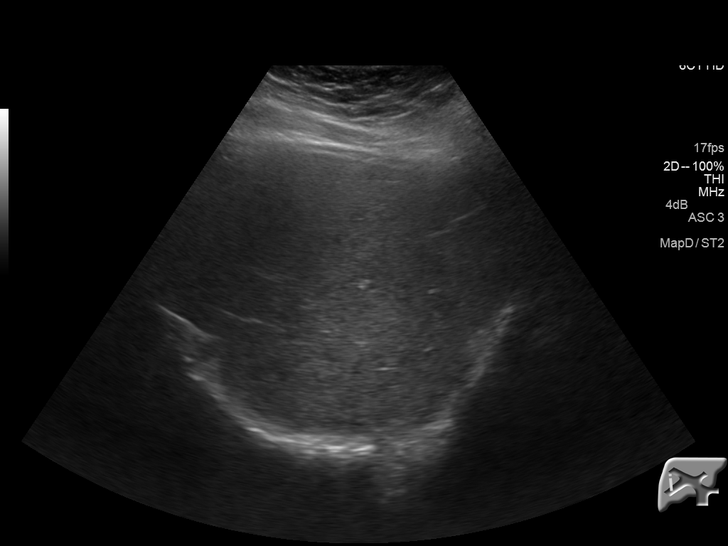
[im 37/49]
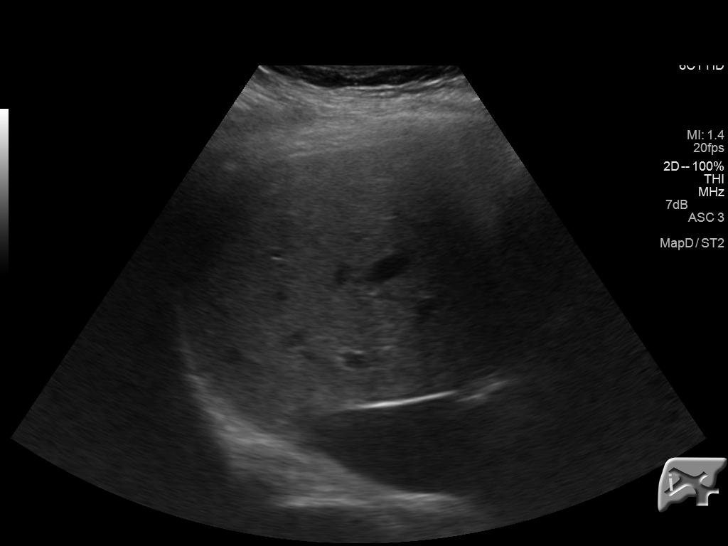
[im 41/49]
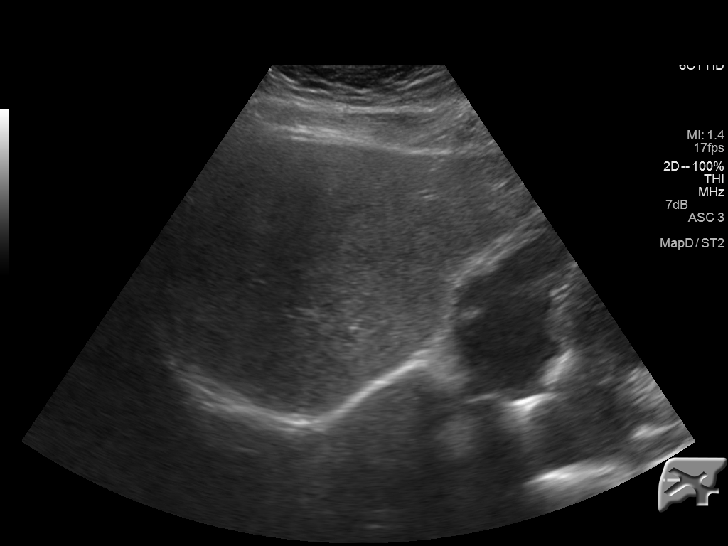
[im 45/49]
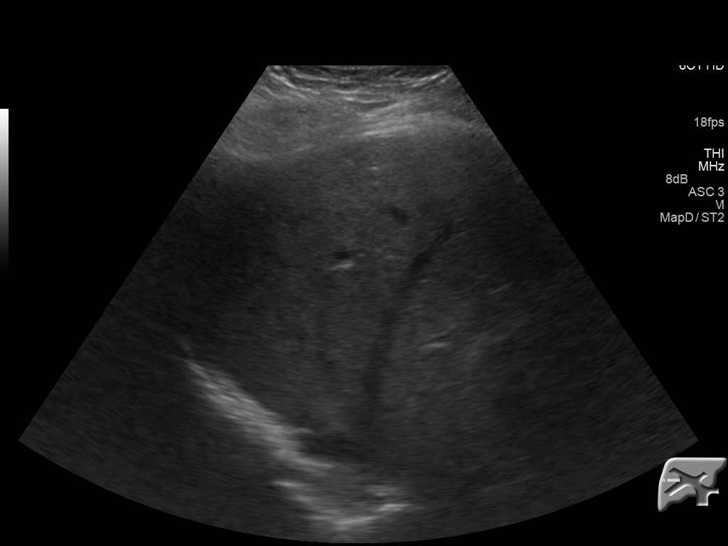
[im 49/49]
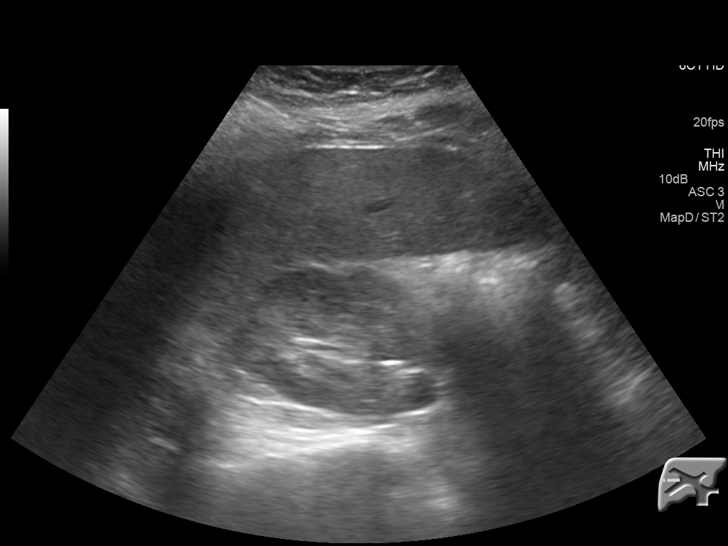

[14 of 25 positions shown; findings below may reference images not displayed]

FINDINGS: Gallbladder:

No gallstones or wall thickening visualized. No sonographic Murphy
sign noted.

Common bile duct:

Diameter: 2.7 mm

Liver:

No focal lesion identified. Within normal limits in parenchymal
echogenicity.
IMPRESSION: Normal examination, unchanged.

## 2015-10-08 DIAGNOSIS — O34211 Maternal care for low transverse scar from previous cesarean delivery: Secondary | ICD-10-CM | POA: Diagnosis not present

## 2015-10-08 DIAGNOSIS — N926 Irregular menstruation, unspecified: Secondary | ICD-10-CM | POA: Diagnosis not present

## 2015-10-08 DIAGNOSIS — Z01419 Encounter for gynecological examination (general) (routine) without abnormal findings: Secondary | ICD-10-CM | POA: Diagnosis not present

## 2015-10-08 DIAGNOSIS — O34219 Maternal care for unspecified type scar from previous cesarean delivery: Secondary | ICD-10-CM | POA: Diagnosis not present

## 2015-10-08 DIAGNOSIS — N912 Amenorrhea, unspecified: Secondary | ICD-10-CM | POA: Diagnosis not present

## 2015-10-08 DIAGNOSIS — Z113 Encounter for screening for infections with a predominantly sexual mode of transmission: Secondary | ICD-10-CM | POA: Diagnosis not present

## 2015-11-03 LAB — OB RESULTS CONSOLE ABO/RH: RH Type: POSITIVE

## 2015-11-03 LAB — OB RESULTS CONSOLE VARICELLA ZOSTER ANTIBODY, IGG: VARICELLA IGG: IMMUNE

## 2015-11-03 LAB — OB RESULTS CONSOLE GC/CHLAMYDIA
Chlamydia: NEGATIVE
Chlamydia: NEGATIVE
GC PROBE AMP, GENITAL: NEGATIVE
Gonorrhea: NEGATIVE

## 2015-11-03 LAB — OB RESULTS CONSOLE HIV ANTIBODY (ROUTINE TESTING): HIV: NONREACTIVE

## 2015-11-03 LAB — OB RESULTS CONSOLE RUBELLA ANTIBODY, IGM: RUBELLA: NON-IMMUNE/NOT IMMUNE

## 2015-11-03 LAB — OB RESULTS CONSOLE RPR: RPR: NONREACTIVE

## 2015-11-03 LAB — OB RESULTS CONSOLE HEPATITIS B SURFACE ANTIGEN: HEP B S AG: NEGATIVE

## 2015-11-06 DIAGNOSIS — O0991 Supervision of high risk pregnancy, unspecified, first trimester: Secondary | ICD-10-CM | POA: Diagnosis not present

## 2015-11-06 DIAGNOSIS — Z3A12 12 weeks gestation of pregnancy: Secondary | ICD-10-CM | POA: Diagnosis not present

## 2015-11-06 DIAGNOSIS — E669 Obesity, unspecified: Secondary | ICD-10-CM | POA: Diagnosis not present

## 2015-11-06 DIAGNOSIS — O99211 Obesity complicating pregnancy, first trimester: Secondary | ICD-10-CM | POA: Diagnosis not present

## 2015-11-21 DIAGNOSIS — M545 Low back pain: Secondary | ICD-10-CM | POA: Diagnosis not present

## 2015-11-21 DIAGNOSIS — O0992 Supervision of high risk pregnancy, unspecified, second trimester: Secondary | ICD-10-CM | POA: Diagnosis not present

## 2015-11-21 DIAGNOSIS — Z3A16 16 weeks gestation of pregnancy: Secondary | ICD-10-CM | POA: Diagnosis not present

## 2015-11-21 DIAGNOSIS — N9489 Other specified conditions associated with female genital organs and menstrual cycle: Secondary | ICD-10-CM | POA: Diagnosis not present

## 2016-01-01 DIAGNOSIS — Z3A20 20 weeks gestation of pregnancy: Secondary | ICD-10-CM | POA: Diagnosis not present

## 2016-01-01 DIAGNOSIS — Z36 Encounter for antenatal screening of mother: Secondary | ICD-10-CM | POA: Diagnosis not present

## 2016-01-01 DIAGNOSIS — Z8759 Personal history of other complications of pregnancy, childbirth and the puerperium: Secondary | ICD-10-CM | POA: Diagnosis not present

## 2016-01-01 DIAGNOSIS — O99213 Obesity complicating pregnancy, third trimester: Secondary | ICD-10-CM | POA: Diagnosis not present

## 2016-03-11 DIAGNOSIS — Z3A28 28 weeks gestation of pregnancy: Secondary | ICD-10-CM | POA: Diagnosis not present

## 2016-03-11 DIAGNOSIS — Z131 Encounter for screening for diabetes mellitus: Secondary | ICD-10-CM | POA: Diagnosis not present

## 2016-03-11 DIAGNOSIS — Z3493 Encounter for supervision of normal pregnancy, unspecified, third trimester: Secondary | ICD-10-CM | POA: Diagnosis not present

## 2016-03-11 DIAGNOSIS — Z113 Encounter for screening for infections with a predominantly sexual mode of transmission: Secondary | ICD-10-CM | POA: Diagnosis not present

## 2016-04-10 DIAGNOSIS — Z3A34 34 weeks gestation of pregnancy: Secondary | ICD-10-CM | POA: Diagnosis not present

## 2016-04-10 DIAGNOSIS — Z362 Encounter for other antenatal screening follow-up: Secondary | ICD-10-CM | POA: Diagnosis not present

## 2016-04-21 DIAGNOSIS — Z3A36 36 weeks gestation of pregnancy: Secondary | ICD-10-CM | POA: Diagnosis not present

## 2016-04-21 DIAGNOSIS — Z113 Encounter for screening for infections with a predominantly sexual mode of transmission: Secondary | ICD-10-CM | POA: Diagnosis not present

## 2016-04-21 LAB — OB RESULTS CONSOLE GBS: GBS: NEGATIVE

## 2016-05-16 ENCOUNTER — Observation Stay
Admission: EM | Admit: 2016-05-16 | Discharge: 2016-05-16 | Disposition: A | Discharge status: Home / Self Care | Payer: BLUE CROSS/BLUE SHIELD | Attending: Certified Nurse Midwife | Admitting: Certified Nurse Midwife

## 2016-05-16 DIAGNOSIS — O26893 Other specified pregnancy related conditions, third trimester: Secondary | ICD-10-CM | POA: Diagnosis not present

## 2016-05-16 DIAGNOSIS — Z79899 Other long term (current) drug therapy: Secondary | ICD-10-CM | POA: Diagnosis not present

## 2016-05-16 DIAGNOSIS — Z3A39 39 weeks gestation of pregnancy: Secondary | ICD-10-CM | POA: Insufficient documentation

## 2016-05-16 DIAGNOSIS — O471 False labor at or after 37 completed weeks of gestation: Principal | ICD-10-CM | POA: Insufficient documentation

## 2016-05-16 DIAGNOSIS — O99213 Obesity complicating pregnancy, third trimester: Secondary | ICD-10-CM | POA: Insufficient documentation

## 2016-05-16 DIAGNOSIS — R109 Unspecified abdominal pain: Secondary | ICD-10-CM

## 2016-05-16 DIAGNOSIS — O26899 Other specified pregnancy related conditions, unspecified trimester: Secondary | ICD-10-CM | POA: Diagnosis present

## 2016-05-16 LAB — URINALYSIS COMPLETE WITH MICROSCOPIC (ARMC ONLY)
Bacteria, UA: NONE SEEN
Bilirubin Urine: NEGATIVE
Glucose, UA: NEGATIVE mg/dL
Hgb urine dipstick: NEGATIVE
KETONES UR: NEGATIVE mg/dL
Leukocytes, UA: NEGATIVE
Nitrite: NEGATIVE
PH: 6 (ref 5.0–8.0)
PROTEIN: NEGATIVE mg/dL
Specific Gravity, Urine: 1.019 (ref 1.005–1.030)

## 2016-05-16 LAB — PROTEIN / CREATININE RATIO, URINE
Creatinine, Urine: 147 mg/dL
PROTEIN CREATININE RATIO: 0.1 mg/mg{creat} (ref 0.00–0.15)
Total Protein, Urine: 14 mg/dL

## 2016-05-16 MED ORDER — ONDANSETRON HCL 4 MG/2ML IJ SOLN: 4.0000 mg | Freq: Four times a day (QID) | INTRAMUSCULAR | Status: DC | PRN

## 2016-05-16 MED ORDER — ACETAMINOPHEN 325 MG PO TABS: 650.0000 mg | ORAL_TABLET | ORAL | Status: DC | PRN

## 2016-05-16 NOTE — OB Triage Note (Signed)
Pt presents c/o of strong contractions starting at 1430 today. Denies any bleeding or LOF.

## 2016-05-16 NOTE — Final Progress Note (Signed)
Physician Final Progress Note  Patient ID: Tiffany Bell MRN: [id number] DOB/AGE: 03-Oct-1985 30 y.o.  Admit date: 05/16/2016 Admitting provider: Malachy Mood, MD/ [facility]. Danise Mina, [place] Discharge date: 05/16/2016   Admission Diagnoses: Contractions IUP at39.6 weeks  Discharge Diagnoses:  IUP at 39.6 weeks Not in labor Consults: none  Significant Findings/ Diagnostic Studies: 30 yo G4 P3003 with EDC=05/22/2016 by LMP=8wk ultrasound presented at 39.6 weeks with c/o increasingly intense contractions for 2 hours PTA. Has had increased mucous discharge, but no bleeding. Baby active. [facility]atal care at [facility] has been remarkable for obesity with current BMI=41, and previous Cesarean section followed by two successful VBACS.  Exam: BP 133/72   Pulse (!) 109   Temp 98 F (36.7 C) (Oral)   Resp 16   LMP 08/11/2015  initial BP 140s/80s. UA with negative proteinuria and PC ratio was 100 mgm protein. Cervix remained unchanged at 4-4.5/60-70%/-1 to -2/ vtx after two hours FHR was 125 with accelerations to 150s, moderate variability Toco: irregular contractions-q6-11+ minutes apart A: IUP at 39.6 weeks, not in labor P: DC home with labor precautions FU appt on 11/15 with Dr Kenton Kingfisher Scheduled IOL for 11/16 AM with Dr Kenton Kingfisher.  Procedures: none  Discharge Condition: stable  Disposition: 01-Home or Self Care  Diet: Regular diet  Discharge Activity: Activity as tolerated     Medication List    STOP taking these medications   ibuprofen 200 MG tablet Commonly known as:  ADVIL,MOTRIN     TAKE these medications   acetaminophen 325 MG tablet Commonly known as:  TYLENOL Take 325 mg by mouth as needed for pain.   ranitidine 150 MG capsule Commonly known as:  ZANTAC Take 1 capsule (150 mg total) by mouth 2 (two) times daily.        Total time spent taking care of this patient: 15 minutes  Signed: Helia Haese 05/16/2016, 7:03 PM

## 2016-05-16 NOTE — Discharge Instructions (Signed)
Keep scheduled appointment, return with increase in contractions, large gush of fluid, bleeding or decreased fetal movement

## 2016-05-21 ENCOUNTER — Inpatient Hospital Stay
Admission: EM | Admit: 2016-05-21 | Discharge: 2016-05-23 | DRG: 775 | Disposition: A | Discharge status: Home / Self Care | Payer: BLUE CROSS/BLUE SHIELD | Attending: Obstetrics & Gynecology | Admitting: Obstetrics & Gynecology

## 2016-05-21 ENCOUNTER — Inpatient Hospital Stay: Payer: BLUE CROSS/BLUE SHIELD | Admitting: Anesthesiology

## 2016-05-21 DIAGNOSIS — O34211 Maternal care for low transverse scar from previous cesarean delivery: Secondary | ICD-10-CM | POA: Diagnosis present

## 2016-05-21 DIAGNOSIS — Z3A40 40 weeks gestation of pregnancy: Secondary | ICD-10-CM | POA: Diagnosis not present

## 2016-05-21 DIAGNOSIS — D649 Anemia, unspecified: Secondary | ICD-10-CM | POA: Diagnosis not present

## 2016-05-21 DIAGNOSIS — O99214 Obesity complicating childbirth: Secondary | ICD-10-CM | POA: Diagnosis not present

## 2016-05-21 DIAGNOSIS — O9081 Anemia of the puerperium: Secondary | ICD-10-CM | POA: Diagnosis not present

## 2016-05-21 DIAGNOSIS — E669 Obesity, unspecified: Secondary | ICD-10-CM | POA: Diagnosis present

## 2016-05-21 DIAGNOSIS — Z6841 Body Mass Index (BMI) 40.0 and over, adult: Secondary | ICD-10-CM

## 2016-05-21 DIAGNOSIS — O480 Post-term pregnancy: Secondary | ICD-10-CM | POA: Diagnosis not present

## 2016-05-21 DIAGNOSIS — O34219 Maternal care for unspecified type scar from previous cesarean delivery: Secondary | ICD-10-CM | POA: Diagnosis not present

## 2016-05-21 LAB — CBC
HCT: 37 % (ref 35.0–47.0)
HEMOGLOBIN: 12.5 g/dL (ref 12.0–16.0)
MCH: 29 pg (ref 26.0–34.0)
MCHC: 33.7 g/dL (ref 32.0–36.0)
MCV: 85.9 fL (ref 80.0–100.0)
Platelets: 211 10*3/uL (ref 150–440)
RBC: 4.31 MIL/uL (ref 3.80–5.20)
RDW: 14.3 % (ref 11.5–14.5)
WBC: 8.8 10*3/uL (ref 3.6–11.0)

## 2016-05-21 LAB — TYPE AND SCREEN
ABO/RH(D): O POS
Antibody Screen: NEGATIVE

## 2016-05-21 MED ORDER — OXYTOCIN BOLUS FROM INFUSION: 500.0000 mL | Freq: Once | INTRAVENOUS | Status: DC

## 2016-05-21 MED ORDER — BUTORPHANOL TARTRATE 1 MG/ML IJ SOLN: 1.0000 mg | INTRAMUSCULAR | Status: DC | PRN

## 2016-05-21 MED ORDER — LACTATED RINGERS IV SOLN: 500.0000 mL | INTRAVENOUS | Status: DC | PRN

## 2016-05-21 MED ORDER — OXYTOCIN 10 UNIT/ML IJ SOLN
INTRAMUSCULAR | Status: AC
  Filled 2016-05-21: qty 2

## 2016-05-21 MED ORDER — AMMONIA AROMATIC IN INHA
RESPIRATORY_TRACT | Status: AC
  Filled 2016-05-21: qty 10

## 2016-05-21 MED ORDER — LACTATED RINGERS IV SOLN: INTRAVENOUS | Status: DC

## 2016-05-21 MED ORDER — LIDOCAINE HCL (PF) 1 % IJ SOLN
INTRAMUSCULAR | Status: AC
  Filled 2016-05-21: qty 30

## 2016-05-21 MED ORDER — TERBUTALINE SULFATE 1 MG/ML IJ SOLN
0.2500 mg | Freq: Once | INTRAMUSCULAR | Status: DC | PRN
  Filled 2016-05-21: qty 1

## 2016-05-21 MED ORDER — ACETAMINOPHEN 325 MG PO TABS: 650.0000 mg | ORAL_TABLET | ORAL | Status: DC | PRN

## 2016-05-21 MED ORDER — SODIUM CHLORIDE 0.9 % IV SOLN
INTRAVENOUS | Status: DC | PRN
  Administered 2016-05-21 (×2): 5 mL via EPIDURAL

## 2016-05-21 MED ORDER — FENTANYL 2.5 MCG/ML W/ROPIVACAINE 0.2% IN NS 100 ML EPIDURAL INFUSION (ARMC-ANES)
EPIDURAL | Status: DC | PRN
  Administered 2016-05-21: 10 mL/h via EPIDURAL

## 2016-05-21 MED ORDER — LIDOCAINE-EPINEPHRINE (PF) 1.5 %-1:200000 IJ SOLN
INTRAMUSCULAR | Status: DC | PRN
  Administered 2016-05-21: 3 mL via EPIDURAL

## 2016-05-21 MED ORDER — LIDOCAINE HCL (PF) 1 % IJ SOLN
INTRAMUSCULAR | Status: DC | PRN
  Administered 2016-05-21: 1 mL via INTRADERMAL

## 2016-05-21 MED ORDER — ONDANSETRON HCL 4 MG/2ML IJ SOLN: 4.0000 mg | Freq: Four times a day (QID) | INTRAMUSCULAR | Status: DC | PRN

## 2016-05-21 MED ORDER — OXYTOCIN 40 UNITS IN LACTATED RINGERS INFUSION - SIMPLE MED
1.0000 m[IU]/min | INTRAVENOUS | Status: DC
  Administered 2016-05-21: 3 m[IU]/min via INTRAVENOUS

## 2016-05-21 MED ORDER — FENTANYL 2.5 MCG/ML W/ROPIVACAINE 0.2% IN NS 100 ML EPIDURAL INFUSION (ARMC-ANES)
EPIDURAL | Status: AC
  Filled 2016-05-21: qty 100

## 2016-05-21 MED ORDER — OXYTOCIN 40 UNITS IN LACTATED RINGERS INFUSION - SIMPLE MED
1.0000 m[IU]/min | INTRAVENOUS | Status: DC
  Administered 2016-05-21: 1 m[IU]/min via INTRAVENOUS

## 2016-05-21 MED ORDER — OXYTOCIN 40 UNITS IN LACTATED RINGERS INFUSION - SIMPLE MED
2.5000 [IU]/h | INTRAVENOUS | Status: DC
  Filled 2016-05-21: qty 1000

## 2016-05-21 MED ORDER — MISOPROSTOL 200 MCG PO TABS
ORAL_TABLET | ORAL | Status: AC
  Filled 2016-05-21: qty 4

## 2016-05-21 NOTE — Anesthesia Preprocedure Evaluation (Signed)
Anesthesia Evaluation  Patient identified by MRN, date of birth, ID band Patient awake    Reviewed: Allergy & Precautions, H&P , NPO status , Patient's Chart, lab work & pertinent test results  Airway Mallampati: III  TM Distance: >3 FB Neck ROM: full    Dental  (+) Poor Dentition   Pulmonary neg pulmonary ROS,    Pulmonary exam normal breath sounds clear to auscultation       Cardiovascular Exercise Tolerance: Good (-) hypertensionnegative cardio ROS Normal cardiovascular exam Rhythm:regular Rate:Normal     Neuro/Psych    GI/Hepatic negative GI ROS, neg GERD  ,  Endo/Other    Renal/GU   negative genitourinary   Musculoskeletal   Abdominal   Peds  Hematology negative hematology ROS (+)   Anesthesia Other Findings TOLAC  Past Medical History: No date: Bruises easily No date: Thyroid disease     Comment: hypothyroid  Past Surgical History: No date: CESAREAN SECTION No date: wisdom tooth surgery     Reproductive/Obstetrics (+) Pregnancy                             Anesthesia Physical Anesthesia Plan  ASA: III  Anesthesia Plan: Epidural   Post-op Pain Management:    Induction:   Airway Management Planned:   Additional Equipment:   Intra-op Plan:   Post-operative Plan:   Informed Consent: I have reviewed the patients History and Physical, chart, labs and discussed the procedure including the risks, benefits and alternatives for the proposed anesthesia with the patient or authorized representative who has indicated his/her understanding and acceptance.     Plan Discussed with: Anesthesiologist  Anesthesia Plan Comments:         Anesthesia Quick Evaluation

## 2016-05-21 NOTE — Plan of Care (Signed)
Pt here for induction of labor for postdates

## 2016-05-21 NOTE — Plan of Care (Signed)
Pt preparing for epidural. In last minute states she feels dizzy, hot, clammy to touch.  Pt closing eyes and difficult to answer RN questions.  O2 sat placed 85/O2 10 L rebreather placed and MD paged to come to room.  Continues to close eyes but will open to respond to RN.  MD arrives and report given. Pt states she is feeling a little better. Cannot explain to MD exactly what she is feeling now but does continue to ask for her epidural.  Anxiety noted.  See Anes report for v/s and epidural report. Lenore Cordia RNC

## 2016-05-21 NOTE — H&P (Signed)
History and Physical Interval Note:  05/21/2016 2:22 PM  Tiffany Bell  has presented today for INDUCTION OF LABOR (pitocin, amniotomy),  with the diagnosis of Postdates. The various methods of treatment have been discussed with the patient and family. After consideration of risks, benefits and other options for treatment, the patient has consented to  Labor induction .  The patient's history has been reviewed, patient examined, no change in status, and is stable for induction as planned.  See H&P. I have reviewed the patient's chart and labs.  Questions were answered to the patient's satisfaction.    Hoyt Koch

## 2016-05-21 NOTE — Progress Notes (Signed)
  Labor Progress Note   30 y.o. G4P3 @ [redacted]w[redacted]d , admitted for  Pregnancy, Labor Management.   Subjective:  Ready for progress  Objective:  Ht 5\' 7"  (1.702 m)   LMP 08/11/2015  Abd: mild Extr: trace to 1+ bilateral pedal edema SVE: CERVIX: 5 cm dilated, 70 effaced, -3 station  EFM: FHR: 140 bpm, variability: moderate,  accelerations:  Present,  decelerations:  Absent Toco: Frequency: Every 5-9 minutes  Assessment & Plan:  G4P3 @ [redacted]w[redacted]d, admitted for  Pregnancy and Labor/Delivery Management  1. Pain management: none. 2. FWB: FHT category 1.  3. ID: GBS negative 4. Labor management: AROM now clear, monitor for ctxs  All discussed with patient, see orders

## 2016-05-21 NOTE — Anesthesia Procedure Notes (Signed)
Epidural Patient location during procedure: OB Start time: 05/21/2016 9:31 PM End time: 05/21/2016 9:35 PM  Staffing Anesthesiologist: Katy Fitch K Performed: anesthesiologist   Preanesthetic Checklist Completed: patient identified, site marked, surgical consent, pre-op evaluation, timeout performed, IV checked, risks and benefits discussed and monitors and equipment checked  Epidural Patient position: sitting Prep: Betadine Patient monitoring: heart rate, continuous pulse ox and blood pressure Approach: midline Location: L4-L5 Injection technique: LOR saline  Needle:  Needle type: Tuohy  Needle gauge: 17 G Needle length: 9 cm and 9 Needle insertion depth: 8 cm Catheter type: closed end flexible Catheter size: 19 Gauge Catheter at skin depth: 12 cm Test dose: negative and 1.5% lidocaine with Epi 1:200 K  Assessment Sensory level: T10 Events: blood not aspirated, injection not painful, no injection resistance, negative IV test and no paresthesia  Additional Notes Pt. Evaluated and documentation done after procedure finished. Patient identified. Risks/Benefits/Options discussed with patient including but not limited to bleeding, infection, nerve damage, paralysis, failed block, incomplete pain control, headache, blood pressure changes, nausea, vomiting, reactions to medication both or allergic, itching and postpartum back pain. Confirmed with bedside nurse the patient's most recent platelet count. Confirmed with patient that they are not currently taking any anticoagulation, have any bleeding history or any family history of bleeding disorders. Patient expressed understanding and wished to proceed. All questions were answered. Sterile technique was used throughout the entire procedure. Please see nursing notes for vital signs. Test dose was given through epidural catheter and negative prior to continuing to dose epidural or start infusion. Warning signs of high block given  to the patient including shortness of breath, tingling/numbness in hands, complete motor block, or any concerning symptoms with instructions to call for help. Patient was given instructions on fall risk and not to get out of bed. All questions and concerns addressed with instructions to call with any issues or inadequate analgesia.   Patient tolerated the insertion well without immediate complications.Reason for block:procedure for pain

## 2016-05-22 LAB — CBC
HEMATOCRIT: 34.3 % — AB (ref 35.0–47.0)
HEMOGLOBIN: 11.4 g/dL — AB (ref 12.0–16.0)
MCH: 28.6 pg (ref 26.0–34.0)
MCHC: 33.3 g/dL (ref 32.0–36.0)
MCV: 86 fL (ref 80.0–100.0)
Platelets: 203 10*3/uL (ref 150–440)
RBC: 3.99 MIL/uL (ref 3.80–5.20)
RDW: 14.5 % (ref 11.5–14.5)
WBC: 12.6 10*3/uL — ABNORMAL HIGH (ref 3.6–11.0)

## 2016-05-22 LAB — RPR: RPR Ser Ql: NONREACTIVE

## 2016-05-22 MED ORDER — SODIUM CHLORIDE 0.9% FLUSH: 3.0000 mL | INTRAVENOUS | Status: DC | PRN

## 2016-05-22 MED ORDER — NALOXONE HCL 0.4 MG/ML IJ SOLN: 0.4000 mg | INTRAMUSCULAR | Status: DC | PRN

## 2016-05-22 MED ORDER — IBUPROFEN 600 MG PO TABS
ORAL_TABLET | ORAL | Status: AC
  Administered 2016-05-22: 600 mg via ORAL
  Filled 2016-05-22: qty 1

## 2016-05-22 MED ORDER — NALBUPHINE HCL 10 MG/ML IJ SOLN: 5.0000 mg | INTRAMUSCULAR | Status: DC | PRN

## 2016-05-22 MED ORDER — ACETAMINOPHEN 325 MG PO TABS
650.0000 mg | ORAL_TABLET | ORAL | Status: DC | PRN
  Administered 2016-05-22 – 2016-05-23 (×5): 650 mg via ORAL
  Filled 2016-05-22 (×6): qty 2

## 2016-05-22 MED ORDER — MEASLES, MUMPS & RUBELLA VAC ~~LOC~~ INJ
0.5000 mL | INJECTION | Freq: Once | SUBCUTANEOUS | Status: AC
  Administered 2016-05-23: 0.5 mL via SUBCUTANEOUS
  Filled 2016-05-22 (×2): qty 0.5

## 2016-05-22 MED ORDER — NALBUPHINE HCL 10 MG/ML IJ SOLN: 5.0000 mg | Freq: Once | INTRAMUSCULAR | Status: DC | PRN

## 2016-05-22 MED ORDER — DIBUCAINE 1 % RE OINT: 1.0000 "application " | TOPICAL_OINTMENT | RECTAL | Status: DC | PRN

## 2016-05-22 MED ORDER — BENZOCAINE-MENTHOL 20-0.5 % EX AERO
1.0000 "application " | INHALATION_SPRAY | CUTANEOUS | Status: DC | PRN
  Administered 2016-05-23: 1 via TOPICAL
  Filled 2016-05-22: qty 56

## 2016-05-22 MED ORDER — ZOLPIDEM TARTRATE 5 MG PO TABS: 5.0000 mg | ORAL_TABLET | Freq: Every evening | ORAL | Status: DC | PRN

## 2016-05-22 MED ORDER — IBUPROFEN 600 MG PO TABS
600.0000 mg | ORAL_TABLET | Freq: Four times a day (QID) | ORAL | Status: DC
Start: 2016-05-22 — End: 2016-05-22
  Administered 2016-05-22 (×2): 600 mg via ORAL
  Filled 2016-05-22: qty 1

## 2016-05-22 MED ORDER — ONDANSETRON HCL 4 MG PO TABS: 4.0000 mg | ORAL_TABLET | ORAL | Status: DC | PRN

## 2016-05-22 MED ORDER — WITCH HAZEL-GLYCERIN EX PADS: 1.0000 "application " | MEDICATED_PAD | CUTANEOUS | Status: DC | PRN

## 2016-05-22 MED ORDER — FENTANYL 2.5 MCG/ML W/ROPIVACAINE 0.2% IN NS 100 ML EPIDURAL INFUSION (ARMC-ANES): 10.0000 mL/h | EPIDURAL | Status: DC

## 2016-05-22 MED ORDER — ONDANSETRON HCL 4 MG/2ML IJ SOLN: 4.0000 mg | INTRAMUSCULAR | Status: DC | PRN

## 2016-05-22 MED ORDER — IBUPROFEN 600 MG PO TABS
600.0000 mg | ORAL_TABLET | Freq: Four times a day (QID) | ORAL | Status: DC
  Administered 2016-05-22 – 2016-05-23 (×4): 600 mg via ORAL
  Filled 2016-05-22 (×4): qty 1

## 2016-05-22 MED ORDER — SODIUM CHLORIDE 0.9 % IV SOLN: 250.0000 mL | INTRAVENOUS | Status: DC | PRN

## 2016-05-22 MED ORDER — NALOXONE HCL 2 MG/2ML IJ SOSY: 1.0000 ug/kg/h | PREFILLED_SYRINGE | INTRAVENOUS | Status: DC | PRN

## 2016-05-22 MED ORDER — DIPHENHYDRAMINE HCL 25 MG PO CAPS: 25.0000 mg | ORAL_CAPSULE | ORAL | Status: DC | PRN

## 2016-05-22 MED ORDER — DIPHENHYDRAMINE HCL 25 MG PO CAPS: 25.0000 mg | ORAL_CAPSULE | Freq: Four times a day (QID) | ORAL | Status: DC | PRN

## 2016-05-22 MED ORDER — SODIUM CHLORIDE 0.9% FLUSH: 3.0000 mL | Freq: Two times a day (BID) | INTRAVENOUS | Status: DC

## 2016-05-22 MED ORDER — SENNOSIDES-DOCUSATE SODIUM 8.6-50 MG PO TABS
2.0000 | ORAL_TABLET | ORAL | Status: DC
  Administered 2016-05-22: 2 via ORAL
  Filled 2016-05-22: qty 2

## 2016-05-22 MED ORDER — DIPHENHYDRAMINE HCL 50 MG/ML IJ SOLN: 12.5000 mg | INTRAMUSCULAR | Status: DC | PRN

## 2016-05-22 MED ORDER — SIMETHICONE 80 MG PO CHEW: 80.0000 mg | CHEWABLE_TABLET | ORAL | Status: DC | PRN

## 2016-05-22 MED ORDER — COCONUT OIL OIL: 1.0000 "application " | TOPICAL_OIL | Status: DC | PRN

## 2016-05-22 NOTE — Discharge Instructions (Signed)

## 2016-05-22 NOTE — Discharge Summary (Signed)
Obstetrical Discharge Summary  Date of Admission: 05/21/2016 Date of Discharge: 05/23/2016  Discharge Diagnosis: Post-date pregnancy and  prior cesarean and obesity Primary OB:  [facility]   Gestational Age at Delivery: [redacted]w[redacted]d  Antepartum complications: post-term and prior cesarean and obesity Date of Delivery: 05/21/2016  Delivered By: Barnett Applebaum, MD Delivery Type: spontaneous vaginal delivery Intrapartum complications/course: None Anesthesia: epidural Placenta: spontaneous Laceration: none Episiotomy: none Live born F  Birth Weight: 10 lbs   APGAR: 9, 9   Post partum course: Since the delivery, patient has tolerate activity, diet, and daily functions without difficulty or complication.  Min lochia.  No breast concerns at this time.  No signs of depression currently.   BP 126/80 (BP Location: Left Arm)   Pulse 77   Temp 97.8 F (36.6 C) (Oral)   Resp 16   Ht 5\' 7"  (1.702 m)   Wt 264 lb (119.7 kg)   LMP 08/11/2015   SpO2 99%   Breastfeeding? Unknown   BMI 41.35 kg/m   Postpartum Exam:General appearance: alert, cooperative and no distress GI: soft, non-tender; bowel sounds normal; no masses,  no organomegaly and Fundus firm Extremities: extremities normal, atraumatic, no cyanosis or edema  Disposition: home with infant Rh Immune globulin given: not applicable Rubella vaccine given: yes Varicella vaccine given: no Tdap vaccine given in AP or PP setting: given during prenatal care Flu vaccine given in AP or PP setting: given during prenatal care Contraception: IUD  Prenatal Labs: O POS//Rubella Not immune//RPR negative//HIV negative/HepB Surface Ag negative//plans to breastfeed  Plan:  [name] was discharged to home in good condition. Follow-up appointment with [facility] provider in 6 weeks  Discharge Medications:   Medication List    TAKE these medications   acetaminophen 325 MG tablet Commonly known as:  TYLENOL Take 325 mg by mouth as needed for pain.    ibuprofen 200 MG tablet Commonly known as:  ADVIL,MOTRIN Take 200 mg by mouth as needed for pain.   ranitidine 150 MG capsule Commonly known as:  ZANTAC Take 1 capsule (150 mg total) by mouth 2 (two) times daily.       Follow-up arrangements:  Follow-up Information    Hoyt Koch, MD Follow up in 6 week(s).   Specialty:  Obstetrics and Gynecology Contact information: [address] [place] [address] [phone]           Prentice Docker, MD 05/23/2016 9:45 AM

## 2016-05-22 NOTE — Anesthesia Postprocedure Evaluation (Signed)
Anesthesia Post Note  Patient: Tiffany Bell  Procedure(s) Performed: * No procedures listed *  Patient location during evaluation: Mother Baby Anesthesia Type: Epidural Level of consciousness: awake and awake and alert Pain management: pain level controlled Vital Signs Assessment: post-procedure vital signs reviewed and stable Respiratory status: spontaneous breathing and nonlabored ventilation Cardiovascular status: blood pressure returned to baseline and stable Postop Assessment: no headache Anesthetic complications: no    Last Vitals:  Vitals:   05/22/16 0006 05/22/16 0318  BP: 124/63 134/86  Pulse: (!) 104 89  Resp:  18  Temp:  37.2 C    Last Pain:  Vitals:   05/22/16 0520  TempSrc:   PainSc: 3                  Buckner [place]

## 2016-05-22 NOTE — Progress Notes (Signed)
  Subjective:  Doing well no concerns, minimal lochia  Objective:   Blood pressure 133/86, pulse 89, temperature 98.4 F (36.9 C), temperature source Oral, resp. rate 17, height 5\' 7"  (1.702 m), weight 264 lb (119.7 kg), last menstrual period 08/11/2015, SpO2 100 %, unknown if currently breastfeeding.  General: NAD Pulmonary: no increased work of breathing Abdomen: non-distended, non-tender, fundus firm at level of umbilicus Extremities: no edema, no erythema, no tenderness  Results for orders placed or performed during the hospital encounter of 05/21/16 (from the past 72 hour(s))  CBC     Status: None   Collection Time: 05/21/16  2:49 PM  Result Value Ref Range   WBC 8.8 3.6 - 11.0 K/uL   RBC 4.31 3.80 - 5.20 MIL/uL   Hemoglobin 12.5 12.0 - 16.0 g/dL   HCT 37.0 35.0 - 47.0 %   MCV 85.9 80.0 - 100.0 fL   MCH 29.0 26.0 - 34.0 pg   MCHC 33.7 32.0 - 36.0 g/dL   RDW 14.3 11.5 - 14.5 %   Platelets 211 150 - 440 K/uL  Type and screen [place]     Status: None   Collection Time: 05/21/16  2:49 PM  Result Value Ref Range   ABO/RH(D) O POS    Antibody Screen NEG    Sample Expiration 05/24/2016   RPR     Status: None   Collection Time: 05/21/16  2:49 PM  Result Value Ref Range   RPR Ser Ql Non Reactive Non Reactive    Comment: (NOTE) Performed At: [facility] [place], [place] [id number] Lindon Romp MD [id number]   CBC     Status: Abnormal   Collection Time: 05/22/16  5:48 AM  Result Value Ref Range   WBC 12.6 (H) 3.6 - 11.0 K/uL   RBC 3.99 3.80 - 5.20 MIL/uL   Hemoglobin 11.4 (L) 12.0 - 16.0 g/dL   HCT 34.3 (L) 35.0 - 47.0 %   MCV 86.0 80.0 - 100.0 fL   MCH 28.6 26.0 - 34.0 pg   MCHC 33.3 32.0 - 36.0 g/dL   RDW 14.5 11.5 - 14.5 %   Platelets 203 150 - 440 K/uL    Assessment:   30 y.o. G4P1004 postpartum day # 1 VBAC  Plan:    1) Acute blood loss anemia - hemodynamically stable and asymptomatic - po ferrous  sulfate  2) --/--/O POS (11/16 1449) / Nonimmune (04/30 0000) / Varicella IMmune   3) TDAP status UTD  4) Breast/IUD  5) Disposition dishcarge PPD2

## 2016-05-23 NOTE — Progress Notes (Signed)
D/C instructions provided, pt states understanding, aware of follow up appt.      

## 2016-05-23 NOTE — Progress Notes (Signed)
D/C home via wheelchair to car by staff.

## 2016-07-03 DIAGNOSIS — Z30430 Encounter for insertion of intrauterine contraceptive device: Secondary | ICD-10-CM | POA: Diagnosis not present

## 2016-07-06 HISTORY — PX: INTRAUTERINE DEVICE INSERTION: SHX323

## 2016-08-03 DIAGNOSIS — Z30431 Encounter for routine checking of intrauterine contraceptive device: Secondary | ICD-10-CM | POA: Diagnosis not present

## 2016-08-19 DIAGNOSIS — Z23 Encounter for immunization: Secondary | ICD-10-CM | POA: Diagnosis not present

## 2016-10-20 DIAGNOSIS — J039 Acute tonsillitis, unspecified: Secondary | ICD-10-CM | POA: Diagnosis not present

## 2016-11-14 DIAGNOSIS — J020 Streptococcal pharyngitis: Secondary | ICD-10-CM | POA: Diagnosis not present

## 2016-11-14 DIAGNOSIS — J029 Acute pharyngitis, unspecified: Secondary | ICD-10-CM | POA: Diagnosis not present

## 2017-01-13 ENCOUNTER — Ambulatory Visit (INDEPENDENT_AMBULATORY_CARE_PROVIDER_SITE_OTHER): Payer: BLUE CROSS/BLUE SHIELD | Admitting: Obstetrics and Gynecology

## 2017-01-13 ENCOUNTER — Encounter: Payer: Self-pay | Admitting: Obstetrics and Gynecology

## 2017-01-13 VITALS — BP 114/76 | HR 100 | Ht 67.0 in | Wt 241.0 lb

## 2017-01-13 DIAGNOSIS — Z30431 Encounter for routine checking of intrauterine contraceptive device: Secondary | ICD-10-CM | POA: Diagnosis not present

## 2017-01-13 DIAGNOSIS — M545 Low back pain: Secondary | ICD-10-CM | POA: Diagnosis not present

## 2017-01-13 DIAGNOSIS — R350 Frequency of micturition: Secondary | ICD-10-CM

## 2017-01-13 DIAGNOSIS — R102 Pelvic and perineal pain: Secondary | ICD-10-CM | POA: Diagnosis not present

## 2017-01-13 LAB — POCT URINALYSIS DIPSTICK
BILIRUBIN UA: NEGATIVE
Blood, UA: NEGATIVE
Glucose, UA: NEGATIVE
KETONES UA: NEGATIVE
LEUKOCYTES UA: NEGATIVE
Nitrite, UA: NEGATIVE
Protein, UA: NEGATIVE
SPEC GRAV UA: 1.02 (ref 1.010–1.025)
Urobilinogen, UA: NEGATIVE E.U./dL — AB
pH, UA: 7 (ref 5.0–8.0)

## 2017-01-13 LAB — POCT URINE PREGNANCY: Preg Test, Ur: NEGATIVE

## 2017-01-13 NOTE — Progress Notes (Signed)
Chief Complaint  Patient presents with  . Urinary Tract Infection    lower back pain/urgency/pelvic pain    HPI:      [name] is a 31 y.o. G4P1004 who LMP was No LMP recorded. Patient is not currently having periods (Reason: IUD)., presents today for possible UTI. She has noted LBP, urinary urgency and frequency recently, and chills last night without a fever. She has also had suprapubic pain and cramping that comes and goes. She has a normal vag d/c, no odor or irritation. She has noted dyspareunia ext and internally recently as well. She has the Paragard IUD for the past 8 months. She is breastfeeding and has only had 1 menses since delivery. She had nausea and diarrhea yesterday, but no sx today.     Patient Active Problem List   Diagnosis Date Noted  . Post term pregnancy 05/21/2016  . Abdominal pain affecting pregnancy 05/16/2016  . Abdominal pain, epigastric 10/27/2012    History reviewed. No pertinent family history.  Social History   Social History  . Marital status: Married    Spouse name: N/A  . Number of children: N/A  . Years of education: N/A   Occupational History  . Not on file.   Social History Main Topics  . Smoking status: Never Smoker  . Smokeless tobacco: Never Used  . Alcohol use No  . Drug use: No  . Sexual activity: Yes    Birth control/ protection: None   Other Topics Concern  . Not on file   Social History Narrative  . No narrative on file     Current Outpatient Prescriptions:  .  PARAGARD INTRAUTERINE COPPER IU, by Intrauterine route., Disp: , Rfl:   Review of Systems  Constitutional: Positive for chills. Negative for fever.  Gastrointestinal: Positive for diarrhea and nausea. Negative for blood in stool, constipation and vomiting.  Genitourinary: Positive for pelvic pain and vaginal discharge. Negative for dyspareunia, dysuria, flank pain, frequency, hematuria, urgency, vaginal bleeding and vaginal pain.    Musculoskeletal: Negative for back pain.  Skin: Negative for rash.     OBJECTIVE:   Vitals:  BP 114/76 (BP Location: Left Arm, Patient Position: Sitting, Cuff Size: Large)   Pulse 100   Ht 5\' 7"  (1.702 m)   Wt 241 lb (109.3 kg)   Breastfeeding? Yes   BMI 37.75 kg/m   Physical Exam  Constitutional: She is oriented to person, place, and time and well-developed, well-nourished, and in no distress. Vital signs are normal.  Abdominal: There is tenderness in the right lower quadrant, suprapubic area and left lower quadrant. There is no rigidity, no guarding and no CVA tenderness.  Genitourinary: Vagina normal, cervix normal, right adnexa normal, left adnexa normal and vulva normal. Uterus is tender. Uterus is not enlarged. Cervix exhibits no motion tenderness and no tenderness. Right adnexum displays no mass and no tenderness. Left adnexum displays no mass and no tenderness. Vulva exhibits no erythema, no exudate, no lesion, no rash and no tenderness. Vagina exhibits no lesion.  Genitourinary Comments: IUD STRINGS IN PLACE  Musculoskeletal:       Lumbar back: She exhibits tenderness. She exhibits no bony tenderness and no edema.  Neurological: She is oriented to person, place, and time.  Vitals reviewed.   Results: Results for orders placed or performed in visit on 01/13/17 (from the past 24 hour(s))  POCT urinalysis dipstick     Status: Abnormal   Collection Time: 01/13/17 10:14 AM  Result  Value Ref Range   Color, UA Gold    Clarity, UA dark    Glucose, UA Negative    Bilirubin, UA Negative    Ketones, UA Negative    Spec Grav, UA 1.020 1.010 - 1.025   Blood, UA Negative    pH, UA 7.0 5.0 - 8.0   Protein, UA Negative    Urobilinogen, UA negative (A) 0.2 or 1.0 E.U./dL   Nitrite, UA Negative    Leukocytes, UA Negative Negative  POCT urine pregnancy     Status: Normal   Collection Time: 01/13/17 10:47 AM  Result Value Ref Range   Preg Test, Ur Negative Negative      Assessment/Plan: Pelvic pain - Tender on exam. Question GI since diarrhea yesterday vs GYN. Check C&S. Will f/u with results. If neg and sx persist, will chk u/s. Offered today-pt fine waitin  Urinary frequency - Neg dip. Check C&S given sx hx.  - Plan: POCT urinalysis dipstick, Urine Culture  Acute midline low back pain, with sciatica presence unspecified - Tender on exam. Paravertebral spinal tenderness with palpation. Stretch/ice/heat. - Plan: POCT urinalysis dipstick, POCT urine pregnancy  Encounter for routine checking of intrauterine contraceptive device (IUD) - IUD strings in place.      Return if symptoms worsen or fail to improve.  Holland Kotter B. Tillmon Kisling, PA-C 01/13/2017 11:17 AM

## 2017-01-15 LAB — URINE CULTURE

## 2017-04-28 DIAGNOSIS — Z23 Encounter for immunization: Secondary | ICD-10-CM | POA: Diagnosis not present

## 2017-07-27 ENCOUNTER — Encounter: Payer: Self-pay | Admitting: Obstetrics and Gynecology

## 2017-07-27 ENCOUNTER — Ambulatory Visit (INDEPENDENT_AMBULATORY_CARE_PROVIDER_SITE_OTHER): Payer: BLUE CROSS/BLUE SHIELD | Admitting: Obstetrics and Gynecology

## 2017-07-27 VITALS — BP 120/80 | HR 79 | Ht 67.0 in | Wt 248.0 lb

## 2017-07-27 DIAGNOSIS — Z1151 Encounter for screening for human papillomavirus (HPV): Secondary | ICD-10-CM | POA: Diagnosis not present

## 2017-07-27 DIAGNOSIS — Z124 Encounter for screening for malignant neoplasm of cervix: Secondary | ICD-10-CM | POA: Diagnosis not present

## 2017-07-27 DIAGNOSIS — Z01419 Encounter for gynecological examination (general) (routine) without abnormal findings: Secondary | ICD-10-CM | POA: Diagnosis not present

## 2017-07-27 DIAGNOSIS — Z1329 Encounter for screening for other suspected endocrine disorder: Secondary | ICD-10-CM

## 2017-07-27 DIAGNOSIS — Z30431 Encounter for routine checking of intrauterine contraceptive device: Secondary | ICD-10-CM

## 2017-07-27 DIAGNOSIS — N914 Secondary oligomenorrhea: Secondary | ICD-10-CM | POA: Diagnosis not present

## 2017-07-27 NOTE — Patient Instructions (Signed)
I value your feedback and entrusting us with your care. If you get a [facility] patient survey, I would appreciate you taking the time to let us know about your experience today. Thank you! 

## 2017-07-27 NOTE — Progress Notes (Signed)
PCP:  Patient, No Pcp Per   Chief Complaint  Patient presents with  . Gynecologic Exam     HPI:      [name] is a 32 y.o. [phone] who LMP was No LMP recorded. Patient is not currently having periods (Reason: IUD)., presents today for her annual examination.  Her menses are infrequent since she was breastfeeding up until 07/10/17. She has had 2 periods, lasting about 4-5 days, heavy flow, but not one in close to 6-7 wks. She has been having cramping/discomfort and had a neg UPT a few wks ago.  She does not have intermenstrual bleeding.  Sex activity: single partner, contraception - IUD. Paragard placed 07/03/16 Last Pap: October 04, 2015  Results were: no abnormalities  Hx of STDs: none  There is no FH of breast cancer. There is no FH of ovarian cancer. The patient does do self-breast exams.  Tobacco use: The patient denies current or previous tobacco use. Alcohol use: none No drug use.  Exercise: moderately active  She does not get adequate calcium and Vitamin D in her diet.  Hx of hyperthyroidism on labs in past, but never had f/u. Hx of thyroid issues in family.    Past Medical History:  Diagnosis Date  . Anemia   . Bruises easily   . Obesity   . Thyroid disease    hypothyroid    Past Surgical History:  Procedure Laterality Date  . CESAREAN SECTION    . INTRAUTERINE DEVICE INSERTION  07/2016  . wisdom tooth surgery      History reviewed. No pertinent family history.  Social History   Socioeconomic History  . Marital status: Married    Spouse name: Not on file  . Number of children: Not on file  . Years of education: Not on file  . Highest education level: Not on file  Social Needs  . Financial resource strain: Not on file  . Food insecurity - worry: Not on file  . Food insecurity - inability: Not on file  . Transportation needs - medical: Not on file  . Transportation needs - non-medical: Not on file  Occupational History  . Not on file    Tobacco Use  . Smoking status: Never Smoker  . Smokeless tobacco: Never Used  Substance and Sexual Activity  . Alcohol use: No  . Drug use: No  . Sexual activity: Yes    Birth control/protection: IUD  Other Topics Concern  . Not on file  Social History Narrative  . Not on file    No outpatient medications have been marked as taking for the 07/27/17 encounter (Office Visit) with Copland, Deirdre Evener, PA-C.     ROS:  Review of Systems  Constitutional: Negative for fatigue, fever and unexpected weight change.  Respiratory: Negative for cough, shortness of breath and wheezing.   Cardiovascular: Negative for chest pain, palpitations and leg swelling.  Gastrointestinal: Negative for blood in stool, constipation, diarrhea, nausea and vomiting.  Endocrine: Negative for cold intolerance, heat intolerance and polyuria.  Genitourinary: Positive for vaginal discharge. Negative for dyspareunia, dysuria, flank pain, frequency, genital sores, hematuria, menstrual problem, pelvic pain, urgency, vaginal bleeding and vaginal pain.  Musculoskeletal: Negative for back pain, joint swelling and myalgias.  Skin: Negative for rash.  Neurological: Negative for dizziness, syncope, light-headedness, numbness and headaches.  Hematological: Negative for adenopathy.  Psychiatric/Behavioral: Negative for agitation, confusion, sleep disturbance and suicidal ideas. The patient is not nervous/anxious.      Objective:  BP 120/80   Pulse 79   Ht 5\' 7"  (1.702 m)   Wt 248 lb (112.5 kg)   BMI 38.84 kg/m    Physical Exam  Constitutional: She is oriented to person, place, and time. She appears well-developed and well-nourished.  Genitourinary: Vagina normal and uterus normal. There is no rash or tenderness on the right labia. There is no rash or tenderness on the left labia. No erythema or tenderness in the vagina. No vaginal discharge found. Right adnexum does not display mass and does not display tenderness.  Left adnexum does not display mass and does not display tenderness.  Cervix exhibits visible IUD strings. Cervix does not exhibit motion tenderness or polyp. Uterus is not enlarged or tender.  Neck: Normal range of motion. No thyromegaly present.  Cardiovascular: Normal rate, regular rhythm and normal heart sounds.  No murmur heard. Pulmonary/Chest: Effort normal and breath sounds normal. Right breast exhibits no mass, no nipple discharge, no skin change and no tenderness. Left breast exhibits no mass, no nipple discharge, no skin change and no tenderness.  Abdominal: Soft. There is no tenderness. There is no guarding.  Musculoskeletal: Normal range of motion.  Neurological: She is alert and oriented to person, place, and time. No cranial nerve deficit.  Psychiatric: She has a normal mood and affect. Her behavior is normal.  Vitals reviewed.   Assessment/Plan: Encounter for annual routine gynecological examination  Cervical cancer screening - Plan: IGP, Aptima HPV  Screening for HPV (human papillomavirus) - Plan: IGP, Aptima HPV  Encounter for routine checking of intrauterine contraceptive device (IUD) - Paragard IUD in place. Due for rem 2027.  Secondary oligomenorrhea - Check thyroid labs. If neg, f/u if menses don't resume to normal in a few monhts after breastfeeding. - Plan: TSH + free T4  Thyroid disorder screening - Plan: TSH + free T4        GYN counsel adequate intake of calcium and vitamin D, diet and exercise     F/U  Return in about 1 year (around 07/27/2018).  Alicia B. Copland, PA-C 07/27/2017 10:44 AM

## 2017-07-28 ENCOUNTER — Telehealth: Payer: Self-pay | Admitting: Obstetrics and Gynecology

## 2017-07-28 DIAGNOSIS — R7989 Other specified abnormal findings of blood chemistry: Secondary | ICD-10-CM

## 2017-07-28 LAB — TSH+FREE T4
FREE T4: 1.5 ng/dL (ref 0.82–1.77)
TSH: 0.274 u[IU]/mL — ABNORMAL LOW (ref 0.450–4.500)

## 2017-07-28 NOTE — Telephone Encounter (Signed)
Pt aware of thyroid labs. Will check u/s and f/u.

## 2017-07-28 NOTE — Telephone Encounter (Signed)
LMTRC. TSH low but free T4 WNL. Pt needs thyroid u/s for goiter. Having secondary amenorrhea since d/c'd breastfeeding.

## 2017-07-29 LAB — IGP, APTIMA HPV
HPV APTIMA: NEGATIVE
PAP SMEAR COMMENT: 0

## 2017-08-02 ENCOUNTER — Ambulatory Visit
Admission: RE | Admit: 2017-08-02 | Discharge: 2017-08-02 | Disposition: A | Discharge status: Home / Self Care | Payer: BLUE CROSS/BLUE SHIELD | Source: Ambulatory Visit | Attending: Obstetrics and Gynecology | Admitting: Obstetrics and Gynecology

## 2017-08-02 DIAGNOSIS — E041 Nontoxic single thyroid nodule: Secondary | ICD-10-CM | POA: Insufficient documentation

## 2017-08-02 DIAGNOSIS — E042 Nontoxic multinodular goiter: Secondary | ICD-10-CM | POA: Diagnosis not present

## 2017-08-02 DIAGNOSIS — R7989 Other specified abnormal findings of blood chemistry: Secondary | ICD-10-CM | POA: Insufficient documentation

## 2017-08-03 ENCOUNTER — Telehealth: Payer: Self-pay | Admitting: Obstetrics and Gynecology

## 2017-08-03 DIAGNOSIS — E041 Nontoxic single thyroid nodule: Secondary | ICD-10-CM

## 2017-08-03 DIAGNOSIS — E0590 Thyrotoxicosis, unspecified without thyrotoxic crisis or storm: Secondary | ICD-10-CM

## 2017-08-03 NOTE — Telephone Encounter (Signed)
Pt aware.  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 TSH low but free T4 WNL (for several yrs). No need to treat at this time. Pt's pulse at our 07/27/17 appt was 79. Pt states sometimes it is in low 100s, but also drinks caffeine and isn't sleeping well. Pt to stop caffeine and see if sx resolve. If not, will refer to endocrine.  REchk thyroid labs and thyroid u/s in 1 yr/sooner prn sx.    IMPRESSION: Nodule 1 in the isthmus meets criteria for annual follow-up  Hypoechoic small nodules in the right lobe do not meet criteria for biopsy nor follow-up.  The above is in keeping with the ACR TI-RADS recommendations - J Am Coll Radiol 2017;14:587-595.   Electronically Signed   By: Marybelle Killings M.D.   On: 08/02/2017 16:40

## 2018-03-02 ENCOUNTER — Ambulatory Visit (INDEPENDENT_AMBULATORY_CARE_PROVIDER_SITE_OTHER): Payer: 59 | Admitting: Obstetrics and Gynecology

## 2018-03-02 ENCOUNTER — Encounter: Payer: Self-pay | Admitting: Obstetrics and Gynecology

## 2018-03-02 VITALS — BP 130/80 | HR 88 | Ht 67.0 in | Wt 259.0 lb

## 2018-03-02 DIAGNOSIS — N938 Other specified abnormal uterine and vaginal bleeding: Secondary | ICD-10-CM

## 2018-03-02 DIAGNOSIS — R5382 Chronic fatigue, unspecified: Secondary | ICD-10-CM | POA: Insufficient documentation

## 2018-03-02 DIAGNOSIS — R300 Dysuria: Secondary | ICD-10-CM

## 2018-03-02 DIAGNOSIS — Z3202 Encounter for pregnancy test, result negative: Secondary | ICD-10-CM | POA: Diagnosis not present

## 2018-03-02 LAB — POCT URINALYSIS DIPSTICK
Bilirubin, UA: NEGATIVE
GLUCOSE UA: NEGATIVE
KETONES UA: NEGATIVE
Nitrite, UA: NEGATIVE
Protein, UA: NEGATIVE
SPEC GRAV UA: 1.025 (ref 1.010–1.025)
pH, UA: 6 (ref 5.0–8.0)

## 2018-03-02 LAB — POCT URINE PREGNANCY: PREG TEST UR: NEGATIVE

## 2018-03-02 NOTE — Progress Notes (Signed)
[facility], [place]   Chief Complaint  Patient presents with  . Follow-up    irregular bleeding, when pt wipes sees light blood x 1 week, urinating frequently x 1 week, no burning, pt feels fatigued, pt wants IUD checked    HPI:      [name] is a 32 y.o. [phone] who LMP was Patient's last menstrual period was 02/12/2018 (approximate)., presents today for several issues. Pt with irreg bleeding with Paragard IUD for the past wk. Has had 5 Paragards but never had sx before. Sx with wiping only, doesn't have to wear pads. Had regular menses 02/12/18 that lasted the usual 7 days. Then started have light d/c shortly after her period ended.She has a hx of occas mid-cycle spotting with Paragard.  Pt also with urinary frequency/dysuria. No hx of UTI in past.  Has had LBP but just got new mattress she and husband dislike.  She is also extremely fatigued but doesn't sleep well at night due to kids getting up. She had subclinical hyperthyroidism 1/19. Had multinodular goiter on u/s but f/u u/s not due for 1 yr (1/20). Last pap/annual 1/19.  Past Medical History:  Diagnosis Date  . Anemia   . Bruises easily   . Obesity   . Thyroid disease    hypothyroid    Past Surgical History:  Procedure Laterality Date  . CESAREAN SECTION    . INTRAUTERINE DEVICE INSERTION  07/2016  . wisdom tooth surgery      History reviewed. No pertinent family history.  Social History   Socioeconomic History  . Marital status: Married    Spouse name: Not on file  . Number of children: Not on file  . Years of education: Not on file  . Highest education level: Not on file  Occupational History  . Not on file  Social Needs  . Financial resource strain: Not on file  . Food insecurity:    Worry: Not on file    Inability: Not on file  . Transportation needs:    Medical: Not on file    Non-medical: Not on file  Tobacco Use  . Smoking status: Never Smoker  . Smokeless tobacco: Never Used    Substance and Sexual Activity  . Alcohol use: No  . Drug use: No  . Sexual activity: Yes    Birth control/protection: IUD    Comment: Paragard  Lifestyle  . Physical activity:    Days per week: Not on file    Minutes per session: Not on file  . Stress: Not on file  Relationships  . Social connections:    Talks on phone: Not on file    Gets together: Not on file    Attends religious service: Not on file    Active member of club or organization: Not on file    Attends meetings of clubs or organizations: Not on file    Relationship status: Not on file  . Intimate partner violence:    Fear of current or ex partner: Not on file    Emotionally abused: Not on file    Physically abused: Not on file    Forced sexual activity: Not on file  Other Topics Concern  . Not on file  Social History Narrative  . Not on file    Outpatient Medications Prior to Visit  Medication Sig Dispense Refill  . PARAGARD INTRAUTERINE COPPER IU by Intrauterine route.     No facility-administered medications prior to visit.  ROS:  Review of Systems  Constitutional: Positive for fatigue. Negative for fever and unexpected weight change.  Respiratory: Negative for cough, shortness of breath and wheezing.   Cardiovascular: Negative for chest pain, palpitations and leg swelling.  Gastrointestinal: Positive for diarrhea. Negative for blood in stool, constipation, nausea and vomiting.  Endocrine: Negative for cold intolerance, heat intolerance and polyuria.  Genitourinary: Positive for dysuria, frequency, hematuria, menstrual problem, pelvic pain and vaginal bleeding. Negative for dyspareunia, flank pain, genital sores, urgency, vaginal discharge and vaginal pain.  Musculoskeletal: Positive for back pain. Negative for joint swelling and myalgias.  Skin: Negative for rash.  Neurological: Negative for dizziness, syncope, light-headedness, numbness and headaches.  Hematological: Negative for adenopathy.   Psychiatric/Behavioral: Negative for agitation, confusion, sleep disturbance and suicidal ideas. The patient is not nervous/anxious.     OBJECTIVE:   Vitals:  BP 130/80   Pulse 88   Ht 5\' 7"  (1.702 m)   Wt 259 lb (117.5 kg)   LMP 02/12/2018 (Approximate)   Breastfeeding? No   BMI 40.57 kg/m   Physical Exam  Constitutional: She is oriented to person, place, and time. Vital signs are normal. She appears well-developed.  Neck: Normal range of motion.  Pulmonary/Chest: Effort normal.  Abdominal: There is tenderness in the suprapubic area. There is no CVA tenderness.  Genitourinary: There is no rash, tenderness or lesion on the right labia. There is no rash, tenderness or lesion on the left labia. Uterus is tender. Uterus is not enlarged. Cervix exhibits no motion tenderness. Right adnexum displays no mass and no tenderness. Left adnexum displays no mass and no tenderness. There is bleeding in the vagina. No erythema or tenderness in the vagina. No vaginal discharge found.  Genitourinary Comments: IUD STRINGS IN PLACE  Musculoskeletal: Normal range of motion.  Neurological: She is alert and oriented to person, place, and time. No cranial nerve deficit.  Psychiatric: She has a normal mood and affect. Her behavior is normal. Judgment and thought content normal.  Vitals reviewed.   Results: Results for orders placed or performed in visit on 03/02/18 (from the past 24 hour(s))  POCT Urinalysis Dipstick     Status: Abnormal   Collection Time: 03/02/18 10:54 AM  Result Value Ref Range   Color, UA red    Clarity, UA cloudy    Glucose, UA Negative Negative   Bilirubin, UA neg    Ketones, UA neg    Spec Grav, UA 1.025 1.010 - 1.025   Blood, UA small    pH, UA 6.0 5.0 - 8.0   Protein, UA Negative Negative   Urobilinogen, UA     Nitrite, UA neg    Leukocytes, UA Small (1+) (A) Negative   Appearance     Odor    POCT urine pregnancy     Status: Normal   Collection Time: 03/02/18 10:54  AM  Result Value Ref Range   Preg Test, Ur Negative Negative     Assessment/Plan: DUB (dysfunctional uterine bleeding) - With IUD for a wk. Neg UPT. Follow sx. Reassurance. If persist, will rechek thyroid and sched GYN u/s.  - Plan: POCT urine pregnancy  Dysuria - Essent neg dip. Check C&S. Will f/u if pos.  - Plan: POCT Urinalysis Dipstick, Urine Culture  Chronic fatigue - Most likely due to sleep deprivation. Increase sleep/household changes to accomodate. F/u prn.      Return if symptoms worsen or fail to improve.  Faron Tudisco B. Klein Willcox, PA-C 03/02/2018 11:02 AM

## 2018-03-02 NOTE — Patient Instructions (Signed)
I value your feedback and entrusting us with your care. If you get a [facility] patient survey, I would appreciate you taking the time to let us know about your experience today. Thank you! 

## 2018-03-04 ENCOUNTER — Telehealth: Payer: Self-pay

## 2018-03-04 ENCOUNTER — Other Ambulatory Visit: Payer: Self-pay | Admitting: Obstetrics and Gynecology

## 2018-03-04 LAB — URINE CULTURE

## 2018-03-04 MED ORDER — AMOXICILLIN 500 MG PO CAPS: 500.0000 mg | ORAL_CAPSULE | Freq: Two times a day (BID) | ORAL | 0 refills | Status: AC

## 2018-03-04 NOTE — Progress Notes (Signed)
Rx amox for GBS UTI

## 2018-03-04 NOTE — Progress Notes (Signed)
Pls notify pt that she has UTI and I eRxd abx in for her. Explains her urin sx. F/u prn. Thx.

## 2018-03-04 NOTE — Telephone Encounter (Signed)
Called pt, no answer, vm box full.

## 2018-03-04 NOTE — Progress Notes (Signed)
Called pt and could not leave vm because vm box full.

## 2018-03-08 NOTE — Telephone Encounter (Signed)
Pt stated 8/30 she was still having bleeding. Also being treated for UTI. Tried to call pt back today but VM full.

## 2018-03-08 NOTE — Telephone Encounter (Signed)
Pt's irreg bleeding with paragard stopped. May have regular period again this month but f/u if irreg bleeding recurs for labs/u/s.  Pt's UTI sx resolved. Only has 5 days of meds (I only sent in #10) vs 7 days, but that's ok. If still sx at day 5, will send in RF. F/u prn.

## 2018-03-29 IMAGING — US US THYROID
1 series · 13 of 25 positions shown · non-contrast
Comparison: None.

CLINICAL DATA: Hyperthyroid.  Low TSH

EXAM:
THYROID ULTRASOUND
TECHNIQUE: Ultrasound examination of the thyroid gland and adjacent soft
tissues was performed.

[Series 1: us thyroid · 0.08mm/px · 13 of 56 slices shown]
[im 1/56]
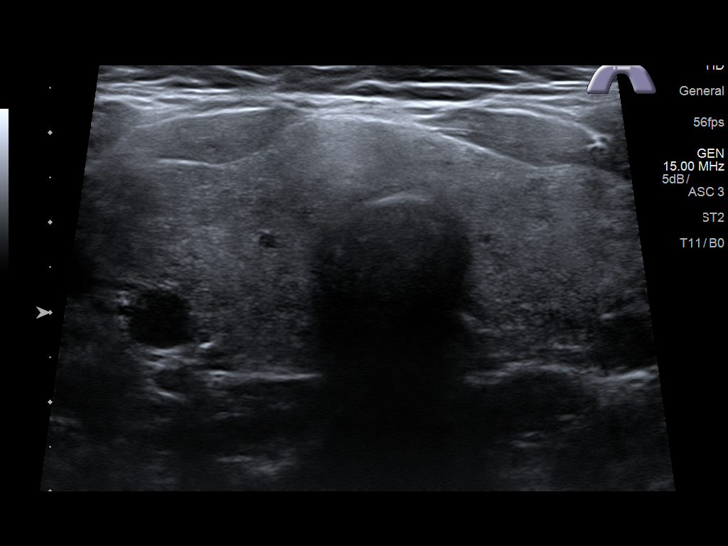
[im 5/56]
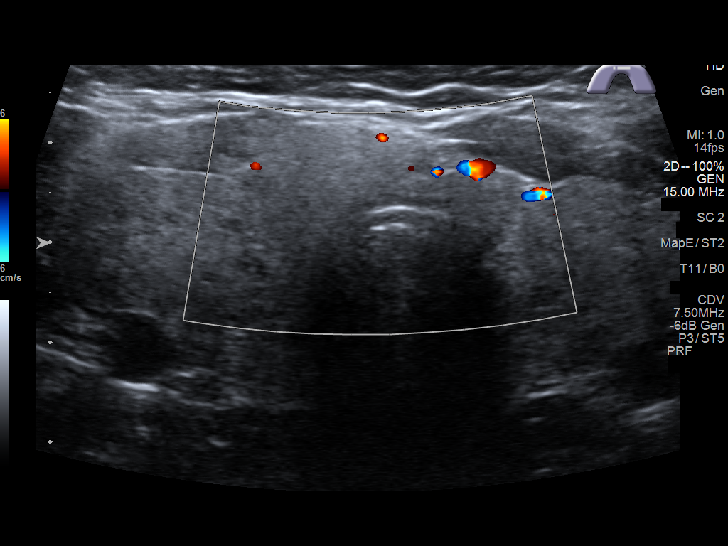
[im 10/56]
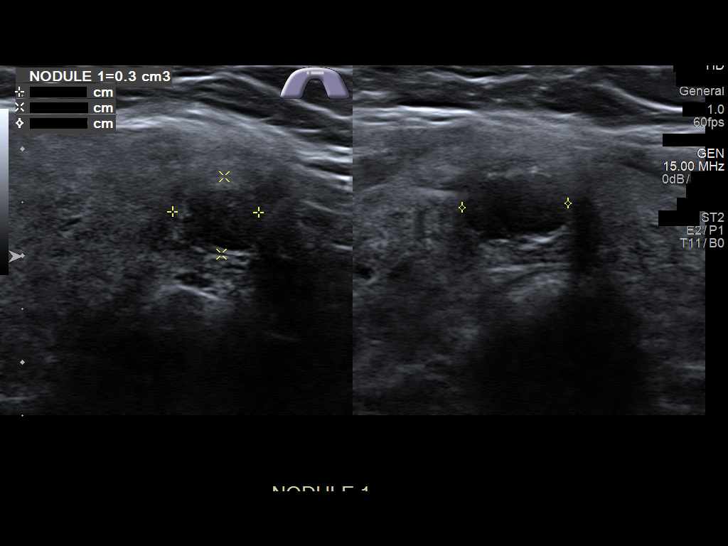
[im 14/56]
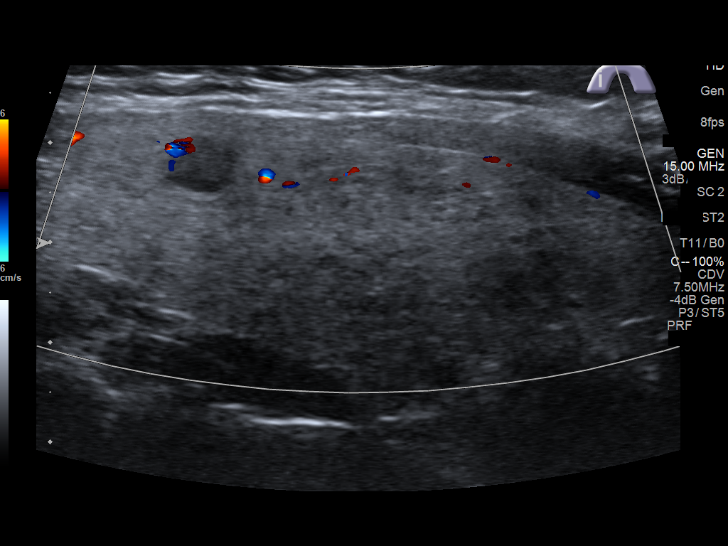
[im 19/56]
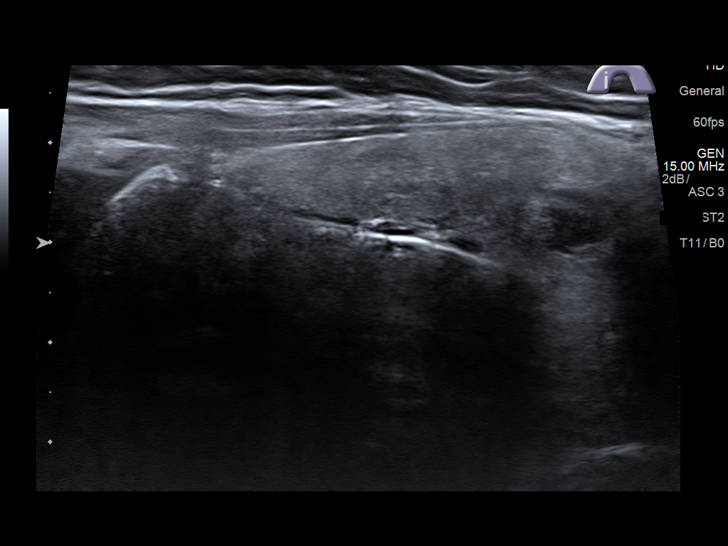
[im 23/56]
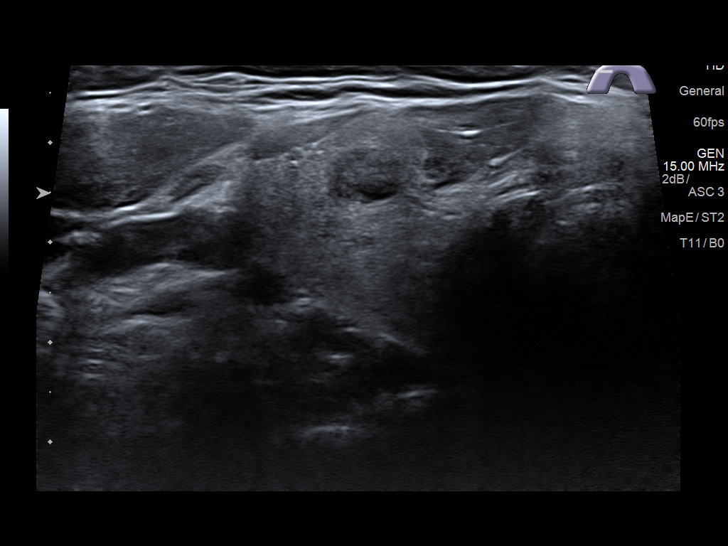
[im 28/56]
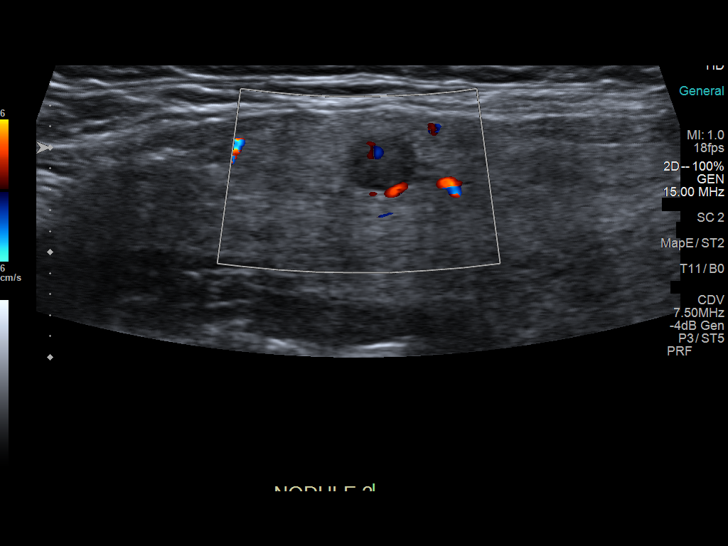
[im 33/56]
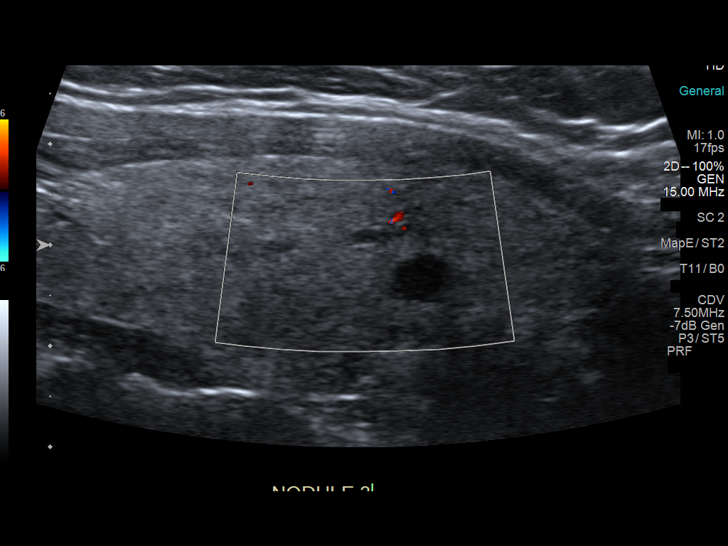
[im 37/56]
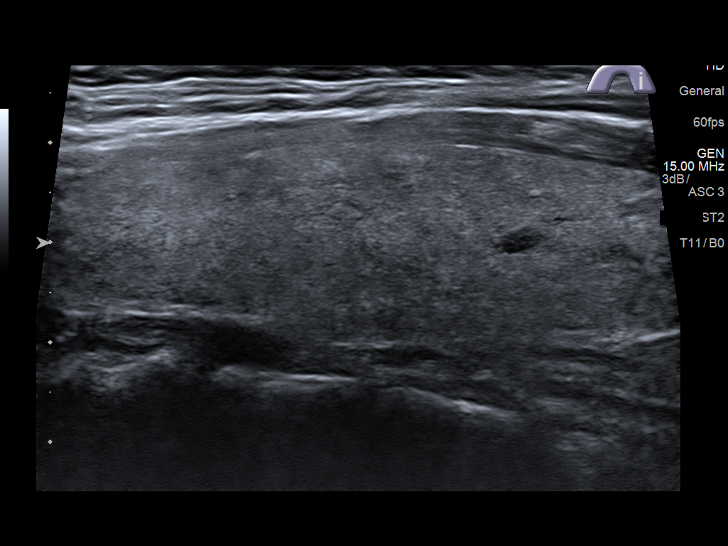
[im 42/56]
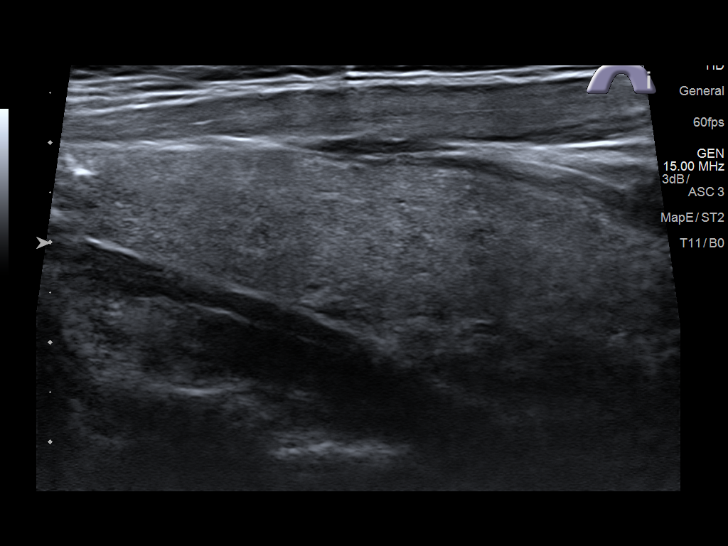
[im 46/56]
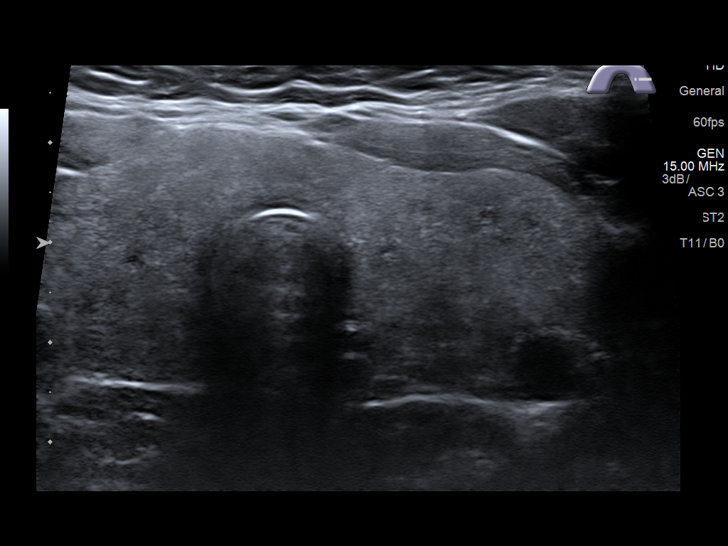
[im 51/56]
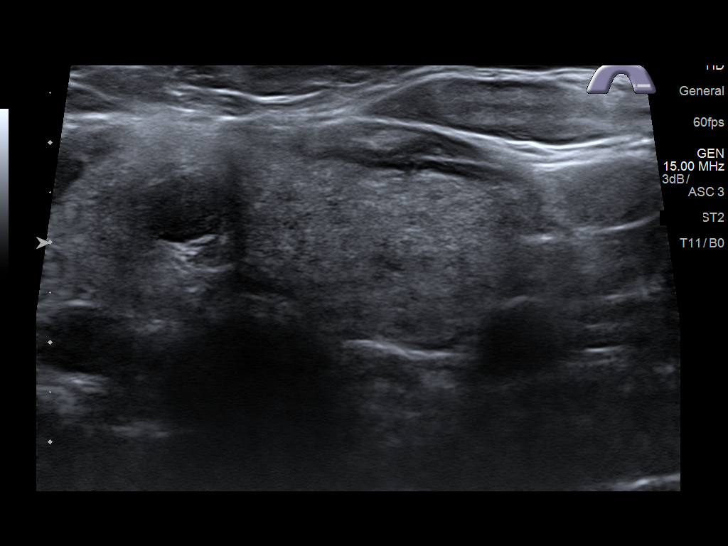
[im 56/56]
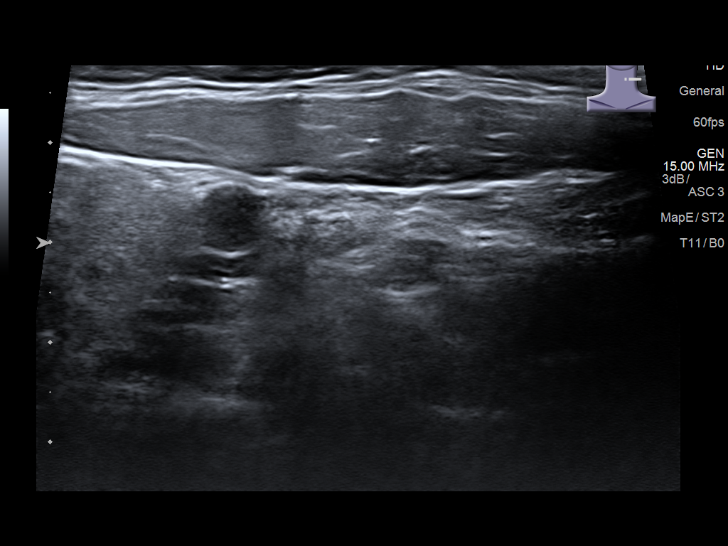

[13 of 25 positions shown; findings below may reference images not displayed]

FINDINGS: Parenchymal Echotexture: Mildly heterogenous

Isthmus: 0.7 cm

Right lobe: 6.4 x 2.8 x 2.3 cm

Left lobe: 6.3 x 2.4 x 2.0 cm

_________________________________________________________

Estimated total number of nodules >/= 1 cm: 1

Number of spongiform nodules >/=  2 cm not described below (TR1): 0

Number of mixed cystic and solid nodules >/= 1.5 cm not described
below (TR2): 0

_________________________________________________________

Nodule # 1:

Location: Isthmus; Mid

Maximum size: 1.0 cm; Other 2 dimensions: 0.8 x 0.7 cm

Composition: cystic/almost completely cystic (0)

Echogenicity: hypoechoic (2)

Shape: not taller-than-wide (0)

Margins: smooth (0)

Echogenic foci: peripheral calcifications (2)

ACR TI-RADS total points: 4.

ACR TI-RADS risk category: TR4 (4-6 points).

ACR TI-RADS recommendations:

*Given size (>/= 1 - 1.4 cm) and appearance, a follow-up ultrasound
in 1 year should be considered based on TI-RADS criteria.

_________________________________________________________

Hypoechoic nodules in the right lobe do not meet criteria for biopsy
nor follow-up.
IMPRESSION: Nodule 1 in the isthmus meets criteria for annual follow-up

Hypoechoic small nodules in the right lobe do not meet criteria for
biopsy nor follow-up.

The above is in keeping with the ACR TI-RADS recommendations - [HOSPITAL] 6642;[DATE].

## 2018-11-02 DIAGNOSIS — D219 Benign neoplasm of connective and other soft tissue, unspecified: Secondary | ICD-10-CM

## 2018-11-02 HISTORY — DX: Benign neoplasm of connective and other soft tissue, unspecified: D21.9

## 2018-12-07 ENCOUNTER — Other Ambulatory Visit: Payer: Self-pay

## 2018-12-07 ENCOUNTER — Ambulatory Visit (INDEPENDENT_AMBULATORY_CARE_PROVIDER_SITE_OTHER): Payer: 59

## 2018-12-07 ENCOUNTER — Ambulatory Visit (INDEPENDENT_AMBULATORY_CARE_PROVIDER_SITE_OTHER): Payer: 59 | Admitting: Podiatry

## 2018-12-07 VITALS — Temp 98.1°F

## 2018-12-07 DIAGNOSIS — S90851A Superficial foreign body, right foot, initial encounter: Secondary | ICD-10-CM | POA: Diagnosis not present

## 2018-12-07 MED ORDER — DOXYCYCLINE HYCLATE 100 MG PO TABS: 100.0000 mg | ORAL_TABLET | Freq: Two times a day (BID) | ORAL | 0 refills | Status: DC

## 2018-12-07 NOTE — Progress Notes (Signed)
  Subjective:  Patient ID: Tiffany Bell, female    DOB: [birth year],  MRN: [id number] HPI Chief Complaint  Patient presents with  . Foot Pain    Patient presents today for glass in her right foot    She [place] on [place].  She was able to pull out a large piece of glass and now is very painful to walk on.  she is concerned that there is still glass in her foot    She has been soaking in Epson salt, cleaning with peroxide and using Bactroban ointment for treatment.    33 y.o. female presents with the above complaint.   ROS: Denies fever chills nausea vomiting muscle aches pains calf pain back pain chest pain shortness of breath.  Past Medical History:  Diagnosis Date  . Anemia   . Bruises easily   . Obesity   . Thyroid disease    hypothyroid   Past Surgical History:  Procedure Laterality Date  . CESAREAN SECTION    . INTRAUTERINE DEVICE INSERTION  07/2016  . wisdom tooth surgery      Current Outpatient Medications:  .  doxycycline (VIBRA-TABS) 100 MG tablet, Take 1 tablet (100 mg total) by mouth 2 (two) times daily., Disp: 20 tablet, Rfl: 0 .  mupirocin ointment (BACTROBAN) 2 %, APPLY ON THE SKIN DAILY WITH BANDAGE CHANGE, Disp: , Rfl:  .  PARAGARD INTRAUTERINE COPPER IU, by Intrauterine route., Disp: , Rfl:   No Known Allergies Review of Systems Objective:   Vitals:   12/07/18 1640  Temp: 98.1 F (36.7 C)    General: Well developed, nourished, in no acute distress, alert and oriented x3   Dermatological: Skin is warm, dry and supple bilateral. Nails x 10 are well maintained; remaining integument appears unremarkable at this time. There are no open sores, no preulcerative lesions, no rash or signs of infection present.  Small punctated lesion with reactive hyperkeratosis once debrided did demonstrate a foreign body which was easily removed.  There appears to be mild cellulitic process surrounding  the area.  Vascular: Dorsalis Pedis artery and Posterior Tibial artery pedal pulses are 2/4 bilateral with immedate capillary fill time. Pedal hair growth present. No varicosities and no lower extremity edema present bilateral.   Neruologic: Grossly intact via light touch bilateral. Vibratory intact via tuning fork bilateral. Protective threshold with Semmes Wienstein monofilament intact to all pedal sites bilateral. Patellar and Achilles deep tendon reflexes 2+ bilateral. No Babinski or clonus noted bilateral.   Musculoskeletal: No gross boney pedal deformities bilateral. No pain, crepitus, or limitation noted with foot and ankle range of motion bilateral. Muscular strength 5/5 in all groups tested bilateral.  Gait: Unassisted, Nonantalgic.    Radiographs:  Radiographs taken today demonstrates a small possibly metallic object or glass in the skin between toes 2 and 3 of the right foot.  No gas in the tissues.  Assessment & Plan:   Assessment: Retention of foreign body right foot with cellulitis and abscess  Plan: I debrided the lesion today after sterile Betadine skin prep and the glass was removed.  Placed her on antibiotics instructed her to soak she tolerated procedure well with mild tenderness.  Follow-up with her in 1 week     Tavyn Kurka T. [place], [place]

## 2018-12-14 ENCOUNTER — Ambulatory Visit: Payer: 59 | Admitting: Podiatry

## 2019-02-07 ENCOUNTER — Other Ambulatory Visit: Payer: Self-pay | Admitting: Physician Assistant

## 2019-02-07 DIAGNOSIS — Z8639 Personal history of other endocrine, nutritional and metabolic disease: Secondary | ICD-10-CM

## 2019-02-07 DIAGNOSIS — Z0000 Encounter for general adult medical examination without abnormal findings: Secondary | ICD-10-CM

## 2019-02-14 ENCOUNTER — Other Ambulatory Visit: Payer: Self-pay | Admitting: Physician Assistant

## 2019-02-14 DIAGNOSIS — Z8639 Personal history of other endocrine, nutritional and metabolic disease: Secondary | ICD-10-CM

## 2019-02-14 DIAGNOSIS — Z0000 Encounter for general adult medical examination without abnormal findings: Secondary | ICD-10-CM

## 2019-02-16 ENCOUNTER — Ambulatory Visit: Payer: BLUE CROSS/BLUE SHIELD

## 2019-02-20 ENCOUNTER — Ambulatory Visit
Admission: RE | Admit: 2019-02-20 | Discharge: 2019-02-20 | Disposition: A | Discharge status: Home / Self Care | Payer: 59 | Source: Ambulatory Visit | Attending: Physician Assistant | Admitting: Physician Assistant

## 2019-02-20 ENCOUNTER — Other Ambulatory Visit: Payer: Self-pay

## 2019-02-20 DIAGNOSIS — Z0000 Encounter for general adult medical examination without abnormal findings: Secondary | ICD-10-CM | POA: Insufficient documentation

## 2019-02-20 DIAGNOSIS — Z8639 Personal history of other endocrine, nutritional and metabolic disease: Secondary | ICD-10-CM | POA: Diagnosis not present

## 2019-07-17 ENCOUNTER — Encounter: Payer: Self-pay | Admitting: Obstetrics and Gynecology

## 2019-07-17 ENCOUNTER — Other Ambulatory Visit: Payer: Self-pay

## 2019-07-17 ENCOUNTER — Ambulatory Visit (INDEPENDENT_AMBULATORY_CARE_PROVIDER_SITE_OTHER): Payer: Managed Care, Other (non HMO) | Admitting: Obstetrics and Gynecology

## 2019-07-17 VITALS — BP 130/80 | Ht 67.0 in | Wt 265.0 lb

## 2019-07-17 DIAGNOSIS — Z01419 Encounter for gynecological examination (general) (routine) without abnormal findings: Secondary | ICD-10-CM

## 2019-07-17 DIAGNOSIS — M5442 Lumbago with sciatica, left side: Secondary | ICD-10-CM

## 2019-07-17 DIAGNOSIS — R102 Pelvic and perineal pain: Secondary | ICD-10-CM

## 2019-07-17 DIAGNOSIS — Z30431 Encounter for routine checking of intrauterine contraceptive device: Secondary | ICD-10-CM

## 2019-07-17 DIAGNOSIS — M5441 Lumbago with sciatica, right side: Secondary | ICD-10-CM

## 2019-07-17 NOTE — Progress Notes (Addendum)
PCP:  [facility]   Chief Complaint  Patient presents with  . Gynecologic Exam     HPI:      [name] is a 34 y.o. [phone] who LMP was Patient's last menstrual period was 06/20/2019 (approximate)., presents today for her annual examination.  Her menses are monthly, lasting 7 days, med to heavy flow, no BTB. Has mild dysmen, no meds taken. She states the IUD feels "sharp", particularly around her period. Has had several IUDs previously and never experienced this symptom.   Sex activity: single partner, contraception - IUD. Paragard placed 07/03/16. Had dyspareunia once last month, usually no problems. Last Pap: 07/27/17  Results were: no abnormalities /neg HPV DNA Hx of STDs: none  There is no FH of breast cancer. There is no FH of ovarian cancer. The patient does do self-breast exams.  Tobacco use: The patient denies current or previous tobacco use. Alcohol use: none No drug use.  Exercise: moderately active but no formal exercise  She does not get adequate calcium and Vitamin D in her diet.  Complains of bad LBP the past 2 days. Hard to bend over, get out of bed. Started a couple months ago; severity waxes and wanes. Had sciatica with pregnancy. Feels like it again but is alternating sides. No back exercise/stretching is helping sx.   Past Medical History:  Diagnosis Date  . Anemia   . Bruises easily   . Obesity   . Thyroid disease    hypothyroid    Past Surgical History:  Procedure Laterality Date  . CESAREAN SECTION    . INTRAUTERINE DEVICE INSERTION  07/2016  . wisdom tooth surgery      History reviewed. No pertinent family history.  Social History   Socioeconomic History  . Marital status: Married    Spouse name: Not on file  . Number of children: Not on file  . Years of education: Not on file  . Highest education level: Not on file  Occupational History  . Not on file  Tobacco Use  . Smoking status: Never Smoker  . Smokeless  tobacco: Never Used  Substance and Sexual Activity  . Alcohol use: No  . Drug use: No  . Sexual activity: Yes    Birth control/protection: I.U.D.    Comment: Paragard  Other Topics Concern  . Not on file  Social History Narrative  . Not on file   Social Determinants of Health   Financial Resource Strain:   . Difficulty of Paying Living Expenses: Not on file  Food Insecurity:   . Worried About Charity fundraiser in the Last Year: Not on file  . Ran Out of Food in the Last Year: Not on file  Transportation Needs:   . Lack of Transportation (Medical): Not on file  . Lack of Transportation (Non-Medical): Not on file  Physical Activity:   . Days of Exercise per Week: Not on file  . Minutes of Exercise per Session: Not on file  Stress:   . Feeling of Stress : Not on file  Social Connections:   . Frequency of Communication with Friends and Family: Not on file  . Frequency of Social Gatherings with Friends and Family: Not on file  . Attends Religious Services: Not on file  . Active Member of Clubs or Organizations: Not on file  . Attends Archivist Meetings: Not on file  . Marital Status: Not on file  Intimate Partner Violence:   .  Fear of Current or Ex-Partner: Not on file  . Emotionally Abused: Not on file  . Physically Abused: Not on file  . Sexually Abused: Not on file    Current Meds  Medication Sig  . PARAGARD INTRAUTERINE COPPER IU by Intrauterine route.     ROS:  Review of Systems  Constitutional: Negative for fatigue, fever and unexpected weight change.  Respiratory: Negative for cough, shortness of breath and wheezing.   Cardiovascular: Negative for chest pain, palpitations and leg swelling.  Gastrointestinal: Negative for blood in stool, constipation, diarrhea, nausea and vomiting.  Endocrine: Negative for cold intolerance, heat intolerance and polyuria.  Genitourinary: Positive for vaginal discharge. Negative for dyspareunia, dysuria, flank pain,  frequency, genital sores, hematuria, menstrual problem, pelvic pain, urgency, vaginal bleeding and vaginal pain.  Musculoskeletal: Negative for back pain, joint swelling and myalgias.  Skin: Negative for rash.  Neurological: Negative for dizziness, syncope, light-headedness, numbness and headaches.  Hematological: Negative for adenopathy.  Psychiatric/Behavioral: Negative for agitation, confusion, sleep disturbance and suicidal ideas. The patient is not nervous/anxious.      Objective: BP 130/80   Ht 5\' 7"  (1.702 m)   Wt 265 lb (120.2 kg)   LMP 06/20/2019 (Approximate)   BMI 41.50 kg/m    Physical Exam Constitutional:      Appearance: She is well-developed.  Genitourinary:     Vagina and uterus normal.     No vaginal discharge, erythema or tenderness.     No cervical motion tenderness or polyp.     IUD strings visualized.     Uterus is not enlarged or tender.     No right or left adnexal mass present.     Right adnexa not tender.     Left adnexa not tender.  Neck:     Thyroid: No thyromegaly.  Cardiovascular:     Rate and Rhythm: Normal rate and regular rhythm.     Heart sounds: Normal heart sounds. No murmur.  Pulmonary:     Effort: Pulmonary effort is normal.     Breath sounds: Normal breath sounds.  Chest:     Breasts:        Right: No mass, nipple discharge, skin change or tenderness.        Left: No mass, nipple discharge, skin change or tenderness.  Abdominal:     Palpations: Abdomen is soft.     Tenderness: There is no abdominal tenderness. There is no guarding.  Musculoskeletal:        General: Normal range of motion.     Cervical back: Normal range of motion.  Neurological:     Mental Status: She is alert and oriented to person, place, and time.     Cranial Nerves: No cranial nerve deficit.  Psychiatric:        Behavior: Behavior normal.  Vitals reviewed.     Assessment/Plan: Encounter for annual routine gynecological examination  Encounter for  routine checking of intrauterine contraceptive device (IUD) - Plan: US Transvaginal Non-OB; IUD strings in cx os. Check u/s for placement. Will f/u with results.   Pelvic pain - Plan: US Transvaginal Non-OB; Check u/s for IUD. If in correct location, can follow sx since tolerable.   Acute bilateral low back pain with bilateral sciatica--refer to [facility] walk-in clinic. Ice/heat/stretch/NSAIDs in meantime.        GYN counsel adequate intake of calcium and vitamin D, diet and exercise     F/U  Return in about 1 day (around 07/18/2019) for GYN  u/s for IUD placement--[facility] to call pt./annual in 1 yr  [place] B. Brantlee Hinde, PA-C 07/17/2019 2:48 PM   ADDENDUM: Pt notified of neg GYN u/s and IUD in correct location. Follow sx prn. Reassurance.   ULTRASOUND REPORT  Location: [facility]  Date of Service: 07/18/2019     Indications:Pelvic Pain Findings:  The uterus is anteverted and measures 10.0 x 5.6 x 5.5 cm. Echo texture is homogenous without evidence of focal masses. The Endometrium measures 8.8 mm. The IUD is correctly placed within the uterus.   Right Ovary measures 3.2 x 2.5 x 2.1 cm. It is normal in appearance. There is a corpus luteal cyst in the right ovary. Left Ovary measures 2.9 x 2.5 x 2.2 cm. It is normal in appearance. Survey of the adnexa demonstrates no adnexal masses. There is no free fluid in the cul de sac.  Impression: 1. Normal pelvic ultrasound. 2. The IUD is correctly placed within the uterus.   Recommendations: 1.Clinical correlation with the patient's History and Physical Exam.  Gweneth Dimitri, RT

## 2019-07-17 NOTE — Patient Instructions (Signed)
I value your feedback and entrusting us with your care. If you get a [facility] patient survey, I would appreciate you taking the time to let us know about your experience today. Thank you!  As of June 15, 2019, your lab results will be released to your MyChart immediately, before I even have a chance to see them. Please give me time to review them and contact you if there are any abnormalities. Thank you for your patience.  

## 2019-07-18 ENCOUNTER — Ambulatory Visit (INDEPENDENT_AMBULATORY_CARE_PROVIDER_SITE_OTHER): Payer: Managed Care, Other (non HMO)

## 2019-07-18 DIAGNOSIS — N8311 Corpus luteum cyst of right ovary: Secondary | ICD-10-CM

## 2019-07-18 DIAGNOSIS — R102 Pelvic and perineal pain: Secondary | ICD-10-CM | POA: Diagnosis not present

## 2019-07-18 DIAGNOSIS — Z30431 Encounter for routine checking of intrauterine contraceptive device: Secondary | ICD-10-CM | POA: Diagnosis not present

## 2020-01-15 ENCOUNTER — Encounter: Payer: Self-pay | Admitting: Obstetrics and Gynecology

## 2020-01-15 ENCOUNTER — Other Ambulatory Visit: Payer: Self-pay

## 2020-01-15 ENCOUNTER — Ambulatory Visit: Payer: 59 | Admitting: Obstetrics and Gynecology

## 2020-01-15 VITALS — BP 120/90 | Ht 67.0 in | Wt 260.0 lb

## 2020-01-15 DIAGNOSIS — Z30431 Encounter for routine checking of intrauterine contraceptive device: Secondary | ICD-10-CM | POA: Diagnosis not present

## 2020-01-15 DIAGNOSIS — Z1329 Encounter for screening for other suspected endocrine disorder: Secondary | ICD-10-CM

## 2020-01-15 DIAGNOSIS — N921 Excessive and frequent menstruation with irregular cycle: Secondary | ICD-10-CM | POA: Diagnosis not present

## 2020-01-15 DIAGNOSIS — R350 Frequency of micturition: Secondary | ICD-10-CM | POA: Diagnosis not present

## 2020-01-15 DIAGNOSIS — Z975 Presence of (intrauterine) contraceptive device: Secondary | ICD-10-CM | POA: Diagnosis not present

## 2020-01-15 LAB — POCT URINALYSIS DIPSTICK
Bilirubin, UA: NEGATIVE
Glucose, UA: NEGATIVE
Ketones, UA: NEGATIVE
Leukocytes, UA: NEGATIVE
Nitrite, UA: NEGATIVE
Protein, UA: NEGATIVE
Spec Grav, UA: 1.025 (ref 1.010–1.025)
pH, UA: 5 (ref 5.0–8.0)

## 2020-01-15 LAB — POCT URINE PREGNANCY: Preg Test, Ur: NEGATIVE

## 2020-01-15 NOTE — Patient Instructions (Signed)
I value your feedback and entrusting us with your care. If you get a [facility] patient survey, I would appreciate you taking the time to let us know about your experience today. Thank you!  As of June 15, 2019, your lab results will be released to your MyChart immediately, before I even have a chance to see them. Please give me time to review them and contact you if there are any abnormalities. Thank you for your patience.  

## 2020-01-15 NOTE — Progress Notes (Signed)
[facility], [place]   Chief Complaint  Patient presents with  . IUD check    cant feel strings, spotting started this a.m.  Marland Kitchen Urinary Tract Infection    frequency and back pain, no burning x 1 week    HPI:      [name] is a 33 y.o. [phone] whose LMP was Patient's last menstrual period was 01/02/2020 (approximate)., presents today for lost IUD strings. Pt's menses are monthly, last 4-5 days, has NEVER had BTB, usually minimal dysmen, but worse this past cycle LLQ. Pt started with BTB today and is concerned. Noticed IUD strings not present. Paragard placed 07/03/16. Had normal GYN u/s for pelvic pain 1/21 with IUD in correct location. No hx of ovar cysts. Hx of thyroid abn in past. Not currently on meds.   She is sex active with husband, occas dyspareunia.  Has also had LBP for about a wk and urinary frequency with good flow, no hematuria, dysuria, pelvic pain, fevers. Was also doing flips off diving board prior to LBP, but had LBP in past with positive UTI on C&S.   Last annual 1/21.   Past Medical History:  Diagnosis Date  . Anemia   . Bruises easily   . Obesity   . Thyroid disease    hypothyroid    Past Surgical History:  Procedure Laterality Date  . CESAREAN SECTION    . INTRAUTERINE DEVICE INSERTION  07/2016  . wisdom tooth surgery      History reviewed. No pertinent family history.  Social History   Socioeconomic History  . Marital status: Married    Spouse name: Not on file  . Number of children: Not on file  . Years of education: Not on file  . Highest education level: Not on file  Occupational History  . Not on file  Tobacco Use  . Smoking status: Never Smoker  . Smokeless tobacco: Never Used  Vaping Use  . Vaping Use: Never used  Substance and Sexual Activity  . Alcohol use: No  . Drug use: No  . Sexual activity: Yes    Birth control/protection: I.U.D.    Comment: Paragard  Other Topics Concern  . Not on file  Social  History Narrative  . Not on file   Social Determinants of Health   Financial Resource Strain:   . Difficulty of Paying Living Expenses:   Food Insecurity:   . Worried About Charity fundraiser in the Last Year:   . Arboriculturist in the Last Year:   Transportation Needs:   . Film/video editor (Medical):   Marland Kitchen Lack of Transportation (Non-Medical):   Physical Activity:   . Days of Exercise per Week:   . Minutes of Exercise per Session:   Stress:   . Feeling of Stress :   Social Connections:   . Frequency of Communication with Friends and Family:   . Frequency of Social Gatherings with Friends and Family:   . Attends Religious Services:   . Active Member of Clubs or Organizations:   . Attends Archivist Meetings:   Marland Kitchen Marital Status:   Intimate Partner Violence:   . Fear of Current or Ex-Partner:   . Emotionally Abused:   Marland Kitchen Physically Abused:   . Sexually Abused:     Outpatient Medications Prior to Visit  Medication Sig Dispense Refill  . PARAGARD INTRAUTERINE COPPER IU by Intrauterine route.     No facility-administered medications prior  to visit.      ROS:  Review of Systems  Constitutional: Negative for fever.  Gastrointestinal: Negative for blood in stool, constipation, diarrhea, nausea and vomiting.  Genitourinary: Positive for frequency, menstrual problem and pelvic pain. Negative for dyspareunia, dysuria, flank pain, hematuria, urgency, vaginal bleeding, vaginal discharge and vaginal pain.  Musculoskeletal: Negative for back pain.  Skin: Negative for rash.   OBJECTIVE:   Vitals:  BP 120/90   Ht 5\' 7"  (1.702 m)   Wt 260 lb (117.9 kg)   LMP 01/02/2020 (Approximate)   BMI 40.72 kg/m   Physical Exam Vitals reviewed.  Constitutional:      Appearance: She is well-developed. She is not ill-appearing or toxic-appearing.  Pulmonary:     Effort: Pulmonary effort is normal.  Abdominal:     Tenderness: There is no right CVA tenderness or left  CVA tenderness.  Genitourinary:    General: Normal vulva.     Pubic Area: No rash.      Labia:        Right: No rash, tenderness or lesion.        Left: No rash, tenderness or lesion.      Vagina: Bleeding present. No vaginal discharge, erythema or tenderness.     Cervix: Normal.     Uterus: Normal. Not enlarged and not tender.      Adnexa: Right adnexa normal and left adnexa normal.       Right: No mass or tenderness.         Left: No mass or tenderness.       Comments: IUD STRINGS IN CX OS Musculoskeletal:        General: Normal range of motion.     Cervical back: Normal range of motion.  Skin:    General: Skin is warm and dry.  Neurological:     General: No focal deficit present.     Mental Status: She is alert and oriented to person, place, and time.     Cranial Nerves: No cranial nerve deficit.  Psychiatric:        Mood and Affect: Mood normal.        Behavior: Behavior normal.        Thought Content: Thought content normal.        Judgment: Judgment normal.     Results: Results for orders placed or performed in visit on 01/15/20 (from the past 24 hour(s))  POCT Urinalysis Dipstick     Status: Normal   Collection Time: 01/15/20  2:39 PM  Result Value Ref Range   Color, UA yellow    Clarity, UA clear    Glucose, UA Negative Negative   Bilirubin, UA neg    Ketones, UA neg    Spec Grav, UA 1.025 1.010 - 1.025   Blood, UA small    pH, UA 5.0 5.0 - 8.0   Protein, UA Negative Negative   Urobilinogen, UA     Nitrite, UA neg    Leukocytes, UA Negative Negative   Appearance     Odor    POCT urine pregnancy     Status: Normal   Collection Time: 01/15/20  2:40 PM  Result Value Ref Range   Preg Test, Ur Negative Negative     Assessment/Plan: Encounter for routine checking of intrauterine contraceptive device (IUD)--IUD strings in place. Offered GYN u/s to assess placement. Pt to hold off for now.   Breakthrough bleeding with IUD - Plan: POCT urine pregnancy, Neg  UPT. Check thyroid  labs. If neg, can follow sx. If cont, will check GYN u/s for placement.   Thyroid disorder screening - Plan: TSH + free T4  Urinary frequency - Plan: POCT Urinalysis Dipstick, Urine Culture, Neg UA, min sx. Check C&S. Will f/u if pos.     Return if symptoms worsen or fail to improve.  Elnor Renovato B. Zeke Aker, PA-C 01/15/2020 2:42 PM

## 2020-01-16 LAB — TSH+FREE T4
Free T4: 1.36 ng/dL (ref 0.82–1.77)
TSH: 0.223 u[IU]/mL — ABNORMAL LOW (ref 0.450–4.500)

## 2020-01-17 LAB — URINE CULTURE

## 2020-03-10 IMAGING — US US THYROID
1 series · 13 of 25 positions shown · non-contrast
Comparison: August 02, 2017

CLINICAL DATA: 32-year-old female with a history thyroid

EXAM:
THYROID ULTRASOUND
TECHNIQUE: Ultrasound examination of the thyroid gland and adjacent soft
tissues was performed.

[Series 1: us thyroid · 0.08mm/px · 13 of 61 slices shown]
[im 1/61]
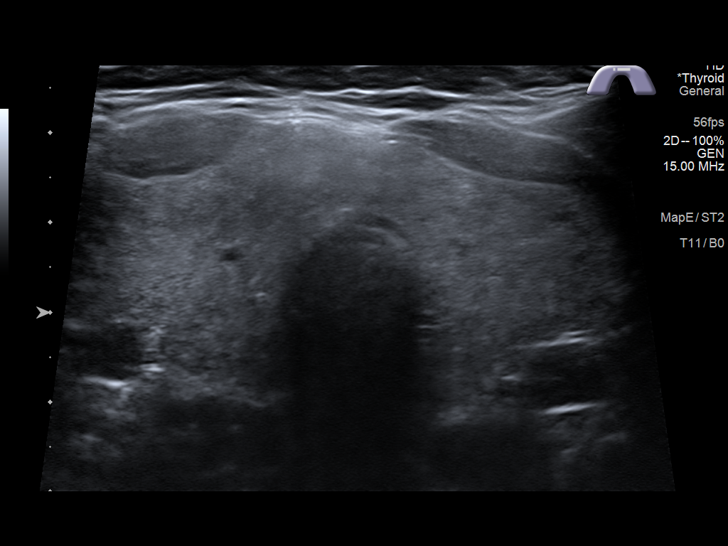
[im 6/61]
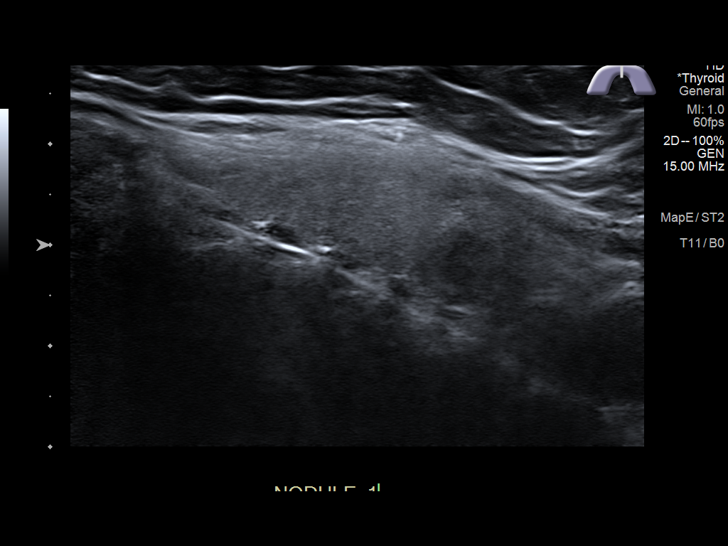
[im 11/61]
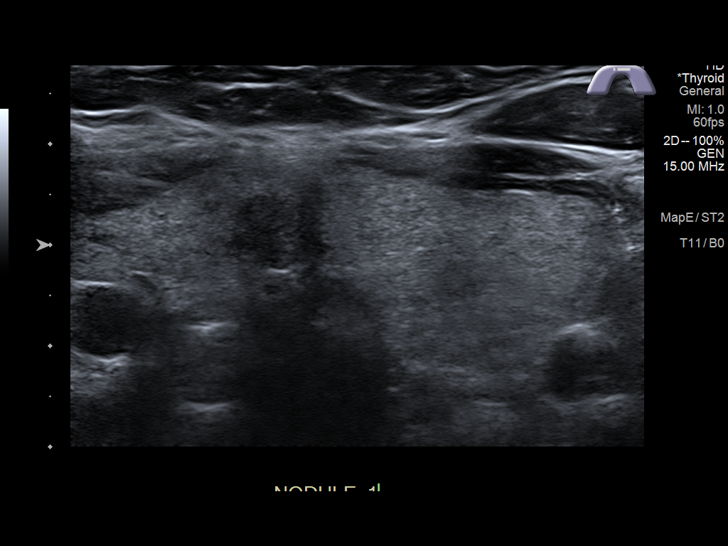
[im 16/61]
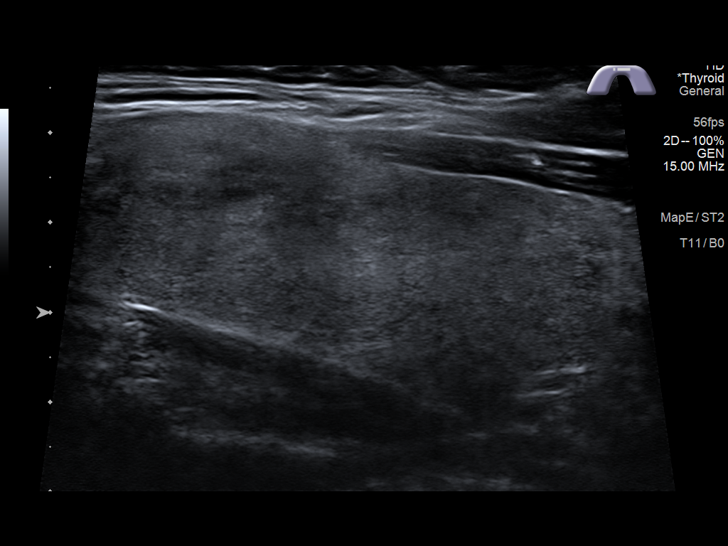
[im 21/61]
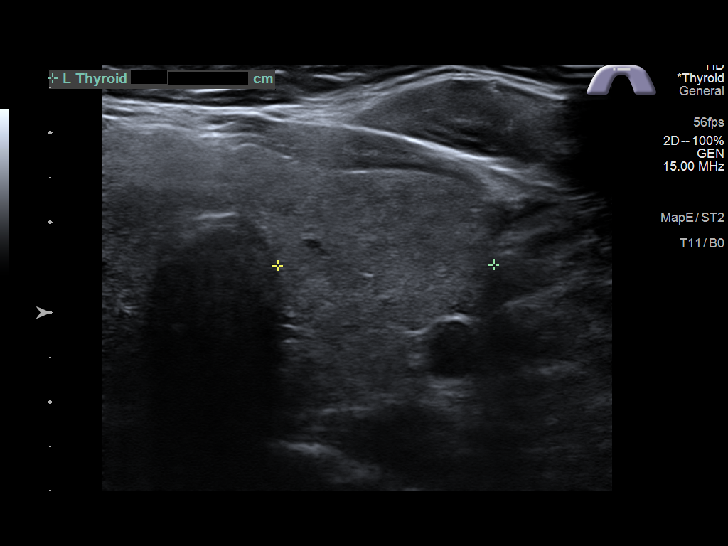
[im 26/61]
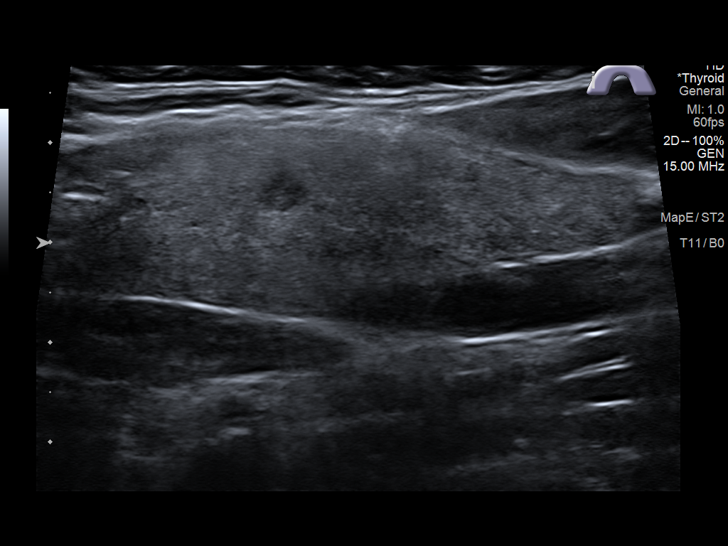
[im 31/61]
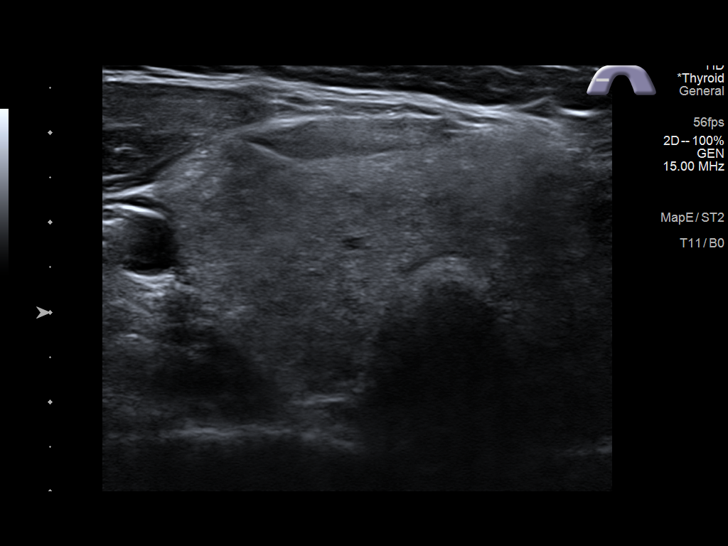
[im 36/61]
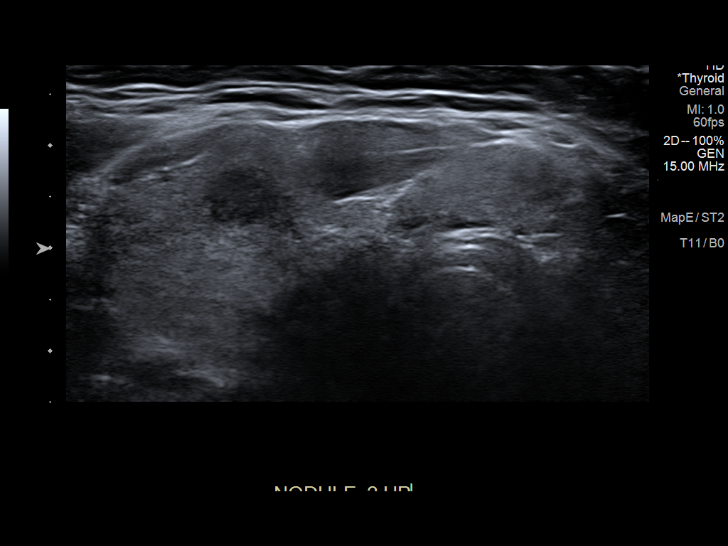
[im 41/61]
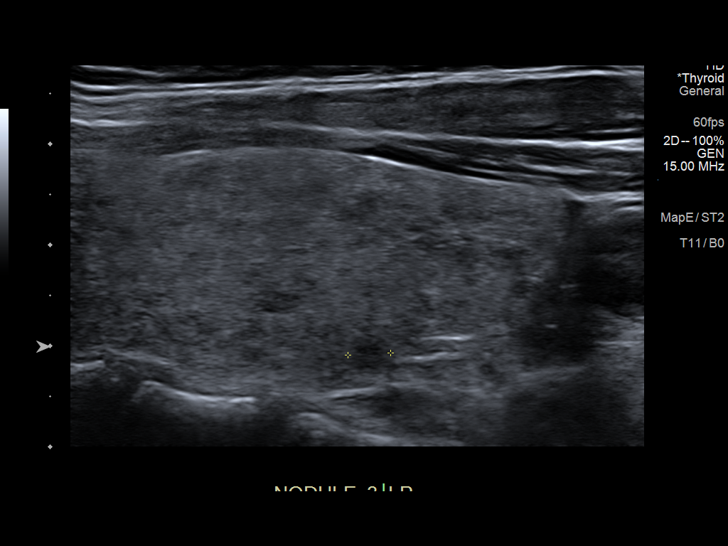
[im 46/61]
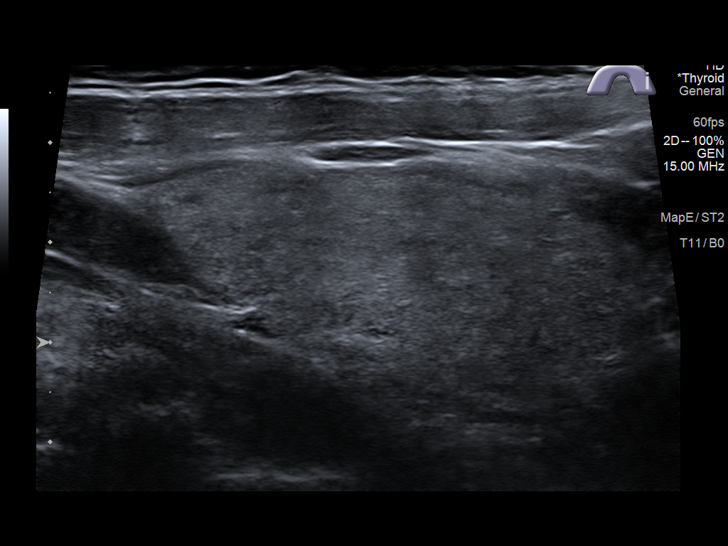
[im 51/61]
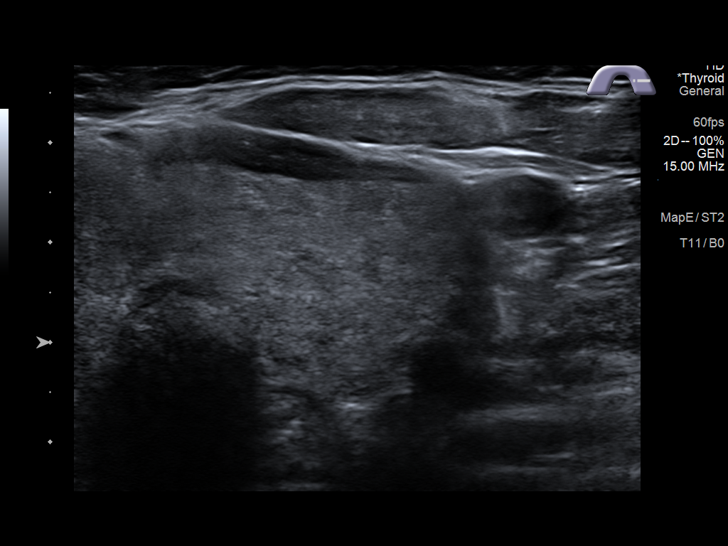
[im 56/61]
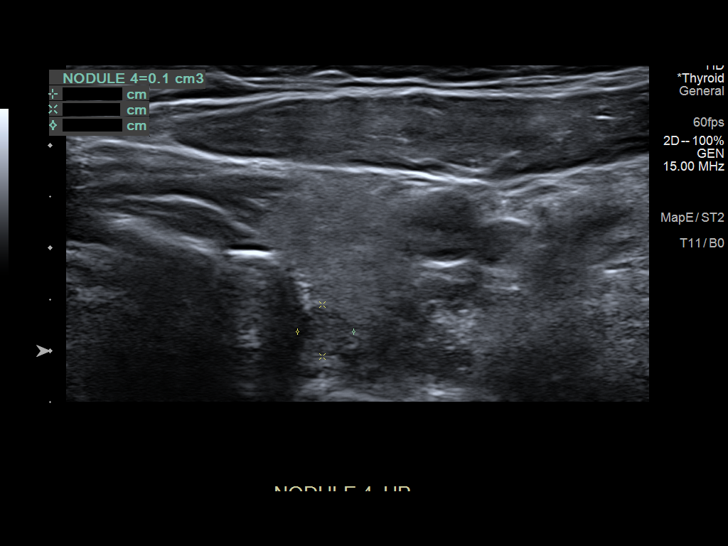
[im 61/61]
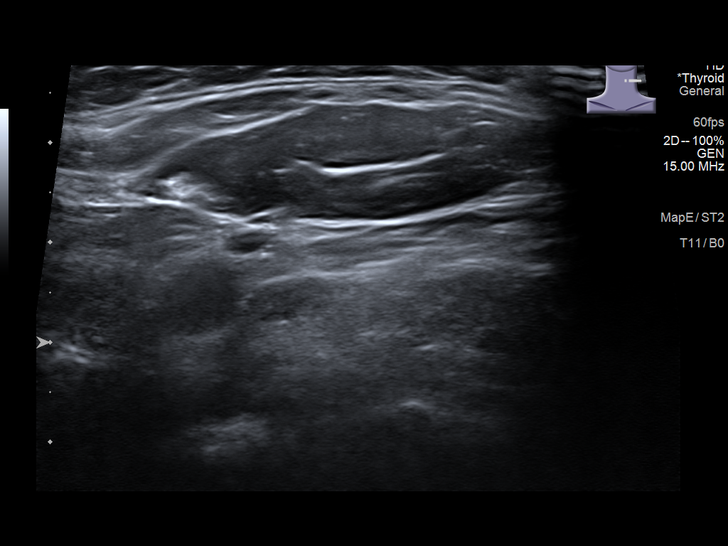

[13 of 25 positions shown; findings below may reference images not displayed]

FINDINGS: Parenchymal Echotexture: Mildly heterogenous

Isthmus: 1.3 cm

Right lobe: 7.1 cm x 2.7 cm x 2.4 cm

Left lobe: 7.0 cm x 2.7 cm x 2.4 cm

_________________________________________________________

Estimated total number of nodules >/= 1 cm: 1

Number of spongiform nodules >/=  2 cm not described below (TR1): 0

Number of mixed cystic and solid nodules >/= 1.5 cm not described
below (TR2): 0

_________________________________________________________

Nodule labeled 1 in the isthmus measures 1.0 cm, unchanged. This
remains TR 4.

Nodule labeled 2 in the upper right thyroid is 9 mm and does not
meet criteria for surveillance or biopsy.

Nodule labeled 3 in the lower right thyroid measures 5 mm and does
not meet criteria for surveillance or biopsy.

Nodule # 4:

Location: Left; Superior

Maximum size: 0.8 cm; Other 2 dimensions: 0.6 cm x 0.5 cm

Composition: cannot determine (2)

Echogenicity: hypoechoic (2)

Shape: not taller-than-wide (0)

Margins: ill-defined (0)

Echogenic foci: none (0)

ACR TI-RADS total points: 4.

ACR TI-RADS risk category: TR4 (4-6 points).

ACR TI-RADS recommendations:

Nodule does not meet criteria for surveillance or biopsy

_________________________________________________________

No adenopathy
IMPRESSION: Redemonstration of enlarged multinodular thyroid.

Isthmic thyroid nodule (labeled 1) meets criteria for surveillance,
as designated by the newly established ACR TI-RADS criteria.
Surveillance ultrasound study recommended to be performed annually
up to 5 years.

Recommendations follow those established by the new ACR TI-RADS
criteria ([HOSPITAL] 0884;[DATE]).

## 2020-07-24 ENCOUNTER — Ambulatory Visit: Payer: 59 | Admitting: Obstetrics and Gynecology

## 2021-01-09 ENCOUNTER — Ambulatory Visit (INDEPENDENT_AMBULATORY_CARE_PROVIDER_SITE_OTHER): Payer: 59 | Admitting: Obstetrics and Gynecology

## 2021-01-09 ENCOUNTER — Encounter: Payer: Self-pay | Admitting: Obstetrics and Gynecology

## 2021-01-09 ENCOUNTER — Other Ambulatory Visit: Payer: Self-pay

## 2021-01-09 ENCOUNTER — Other Ambulatory Visit (HOSPITAL_COMMUNITY)
Admission: RE | Admit: 2021-01-09 | Discharge: 2021-01-09 | Disposition: A | Discharge status: Home / Self Care | Payer: 59 | Source: Ambulatory Visit | Attending: Obstetrics and Gynecology | Admitting: Obstetrics and Gynecology

## 2021-01-09 VITALS — BP 144/90 | Ht 67.0 in | Wt 266.0 lb

## 2021-01-09 DIAGNOSIS — Z01419 Encounter for gynecological examination (general) (routine) without abnormal findings: Secondary | ICD-10-CM

## 2021-01-09 DIAGNOSIS — Z1151 Encounter for screening for human papillomavirus (HPV): Secondary | ICD-10-CM | POA: Diagnosis present

## 2021-01-09 DIAGNOSIS — Z30431 Encounter for routine checking of intrauterine contraceptive device: Secondary | ICD-10-CM | POA: Diagnosis not present

## 2021-01-09 DIAGNOSIS — Z124 Encounter for screening for malignant neoplasm of cervix: Secondary | ICD-10-CM | POA: Insufficient documentation

## 2021-01-09 DIAGNOSIS — Z3201 Encounter for pregnancy test, result positive: Secondary | ICD-10-CM | POA: Diagnosis not present

## 2021-01-09 DIAGNOSIS — N920 Excessive and frequent menstruation with regular cycle: Secondary | ICD-10-CM

## 2021-01-09 LAB — POCT URINE PREGNANCY: Preg Test, Ur: NEGATIVE

## 2021-01-09 NOTE — Progress Notes (Signed)
PCP:  [facility] [place]   Chief Complaint  Patient presents with   Gynecologic Exam    Pt is still bleeding since cycle started (first time ever), had a faint POS UPT 6/29     HPI:      [name] is a 35 y.o. [id number] who LMP was Patient's last menstrual period was 12/31/2020 (exact date)., presents today for her annual examination.  Her menses are monthly, usually lasting 5-7 days, med flow, no BTB. Has mild dysmen, no meds taken. Had menses Q 3 1/2 wks 5/22, then Q4 wks for 6/22 period. Pt started with dark brown/black d/c before 6/22 period started. Did home UPT that was faintly positive. Also had breast tenderness. Period then started and was heavy for 8 days, changing product Q 1 1/2 -2 hrs, with clots. Bleeding is slowing down now. Pt had covid 5/22 before sx started. Hx of IDA in past, Fe supp recommended by PCP but pt not taking. Had WNL CBC 5/22.  Had BTB 7/21 with IUD strings in cx os. Pt with hx of low TSH but normal free T4. Has seen PCP and endocrine and this is "her normal". Had same results on 3/22 labs. Discussed Gyn u/s after 7/21 sx, but pt wanted to follow sx which resolved until recently. IUD in correct place on 1/21 Gyn u/s.   Sex activity: single partner, contraception - IUD. Paragard placed 07/03/16. Husband may get vasectomy. Pt also wonders about other BC options, has never done hormonal BC. No hx of Htn, DVTs, migraines with aura.  Last Pap: 07/27/17  Results were: no abnormalities /neg HPV DNA Hx of STDs: none  There is no FH of breast cancer. There is no FH of ovarian cancer. The patient does do self-breast exams.  Tobacco use: The patient denies current or previous tobacco use. Alcohol use: none No drug use.  Exercise: moderately active but no formal exercise  She does not get adequate calcium and Vitamin D in her diet.  Past Medical History:  Diagnosis Date   Anemia    Bruises easily    Obesity    Thyroid disease    hypothyroid     Past Surgical History:  Procedure Laterality Date   CESAREAN SECTION     INTRAUTERINE DEVICE INSERTION  07/2016   wisdom tooth surgery      History reviewed. No pertinent family history.  Social History   Socioeconomic History   Marital status: Married    Spouse name: Not on file   Number of children: Not on file   Years of education: Not on file   Highest education level: Not on file  Occupational History   Not on file  Tobacco Use   Smoking status: Never   Smokeless tobacco: Never  Vaping Use   Vaping Use: Never used  Substance and Sexual Activity   Alcohol use: Never   Drug use: No   Sexual activity: Yes    Birth control/protection: I.U.D.    Comment: Paragard  Other Topics Concern   Not on file  Social History Narrative   Not on file   Social Determinants of Health   Financial Resource Strain: Not on file  Food Insecurity: Not on file  Transportation Needs: Not on file  Physical Activity: Not on file  Stress: Not on file  Social Connections: Not on file  Intimate Partner Violence: Not on file    Current Meds  Medication Sig   LINZESS 72 MCG  capsule Take 72 mcg by mouth daily.   PARAGARD INTRAUTERINE COPPER IU by Intrauterine route.     ROS:  Review of Systems  Constitutional:  Positive for fatigue. Negative for fever and unexpected weight change.  Respiratory:  Negative for cough, shortness of breath and wheezing.   Cardiovascular:  Negative for chest pain, palpitations and leg swelling.  Gastrointestinal:  Positive for constipation. Negative for blood in stool, diarrhea, nausea and vomiting.  Endocrine: Negative for cold intolerance, heat intolerance and polyuria.  Genitourinary:  Positive for menstrual problem. Negative for dyspareunia, dysuria, flank pain, frequency, genital sores, hematuria, pelvic pain, urgency, vaginal bleeding, vaginal discharge and vaginal pain.  Musculoskeletal:  Negative for back pain, joint swelling and myalgias.   Skin:  Negative for rash.  Neurological:  Negative for dizziness, syncope, light-headedness, numbness and headaches.  Hematological:  Negative for adenopathy.  Psychiatric/Behavioral:  Negative for agitation, confusion, sleep disturbance and suicidal ideas. The patient is not nervous/anxious.     Objective: BP (!) 144/90   Ht 5\' 7"  (1.702 m)   Wt 266 lb (120.7 kg)   LMP 12/31/2020 (Exact Date)   BMI 41.66 kg/m    Physical Exam Constitutional:      Appearance: She is well-developed.  Genitourinary:     Vulva normal.     Right Labia: No rash, tenderness or lesions.    Left Labia: No tenderness, lesions or rash.    No vaginal discharge, erythema or tenderness.      Right Adnexa: not tender and no mass present.    Left Adnexa: not tender and no mass present.    No cervical friability or polyp.     IUD strings visualized.     Uterus is not enlarged or tender.  Breasts:    Right: No mass, nipple discharge, skin change or tenderness.     Left: No mass, nipple discharge, skin change or tenderness.  Neck:     Thyroid: No thyromegaly.  Cardiovascular:     Rate and Rhythm: Normal rate and regular rhythm.     Heart sounds: Normal heart sounds. No murmur heard. Pulmonary:     Effort: Pulmonary effort is normal.     Breath sounds: Normal breath sounds.  Abdominal:     Palpations: Abdomen is soft.     Tenderness: There is no abdominal tenderness. There is no guarding or rebound.  Musculoskeletal:        General: Normal range of motion.     Cervical back: Normal range of motion.  Lymphadenopathy:     Cervical: No cervical adenopathy.  Neurological:     General: No focal deficit present.     Mental Status: She is alert and oriented to person, place, and time.     Cranial Nerves: No cranial nerve deficit.  Skin:    General: Skin is warm and dry.  Psychiatric:        Mood and Affect: Mood normal.        Behavior: Behavior normal.        Thought Content: Thought content normal.         Judgment: Judgment normal.  Vitals reviewed.   Results for orders placed or performed in visit on 01/09/21 (from the past 24 hour(s))  POCT urine pregnancy     Status: Normal   Collection Time: 01/09/21  5:07 PM  Result Value Ref Range   Preg Test, Ur Negative Negative     Assessment/Plan: Encounter for annual routine gynecological examination  Cervical cancer  screening - Plan: Cytology - PAP  Screening for HPV (human papillomavirus) - Plan: Cytology - PAP  Encounter for routine checking of intrauterine contraceptive device (IUD)--IUD strings in cx os.   Positive pregnancy test - Plan: POCT urine pregnancy; neg UPT today.  Question false positive vs SAB.   Menorrhagia with regular cycle--heavier flow since covid 5/22. Bleeding minimal on exam today. Follow cycles. If sx persist, will check GYN u/s. Also discussed hormonal BC for cycle control/improvement. Pt to f/u prn.        GYN counsel adequate intake of calcium and vitamin D, diet and exercise     F/U  Return in about 1 year (around 01/09/2022).   Shreyan Hinz B. Jackson Fetters, PA-C 01/09/2021 5:08 PM

## 2021-01-13 LAB — CYTOLOGY - PAP
Comment: NEGATIVE
Diagnosis: NEGATIVE
High risk HPV: NEGATIVE

## 2021-08-25 ENCOUNTER — Ambulatory Visit: Payer: 59 | Admitting: Obstetrics and Gynecology

## 2021-08-26 ENCOUNTER — Encounter: Payer: Self-pay | Admitting: Obstetrics and Gynecology

## 2021-08-26 ENCOUNTER — Other Ambulatory Visit: Payer: Self-pay

## 2021-08-26 ENCOUNTER — Ambulatory Visit (INDEPENDENT_AMBULATORY_CARE_PROVIDER_SITE_OTHER): Payer: 59 | Admitting: Obstetrics and Gynecology

## 2021-08-26 VITALS — BP 130/80 | Ht 67.0 in | Wt 263.0 lb

## 2021-08-26 DIAGNOSIS — Z30432 Encounter for removal of intrauterine contraceptive device: Secondary | ICD-10-CM | POA: Diagnosis not present

## 2021-08-26 NOTE — Progress Notes (Signed)
° °  Chief Complaint  Patient presents with   IUD removal    Not interested in [facility]     History of Present Illness:  Tiffany Bell is a 36 y.o. that had a Paragard IUD placed approximately 5 years ago. Her husband had a vasectomy. Menses are montly, lasting 5-7 days with IUD.    BP 130/80    Ht 5\' 7"  (1.702 m)    Wt 263 lb (119.3 kg)    LMP 08/13/2021 (Exact Date)    BMI 41.19 kg/m   Pelvic exam:  Two IUD strings present seen coming from the cervical os. EGBUS, vaginal vault and cervix: within normal limits  IUD Removal Strings of IUD identified and grasped.  IUD removed without problem with ring forceps.  Pt tolerated this well.  IUD noted to be intact.  Assessment:  Encounter for IUD removal    Plan: IUD removed and plan for contraception is vasectomy. She was amenable to this plan.  Lupe Handley B. Raffaela Ladley, PA-C 08/26/2021 10:18 AM

## 2022-06-02 ENCOUNTER — Ambulatory Visit: Payer: 59 | Admitting: Dermatology

## 2022-07-14 ENCOUNTER — Ambulatory Visit: Payer: 59 | Admitting: Dermatology

## 2022-08-11 ENCOUNTER — Encounter: Payer: Self-pay | Admitting: Dermatology

## 2022-08-11 ENCOUNTER — Ambulatory Visit: Payer: 59 | Admitting: Dermatology

## 2022-08-11 VITALS — BP 133/79 | HR 92

## 2022-08-11 DIAGNOSIS — L719 Rosacea, unspecified: Secondary | ICD-10-CM | POA: Diagnosis not present

## 2022-08-11 DIAGNOSIS — L814 Other melanin hyperpigmentation: Secondary | ICD-10-CM | POA: Diagnosis not present

## 2022-08-11 DIAGNOSIS — D2239 Melanocytic nevi of other parts of face: Secondary | ICD-10-CM

## 2022-08-11 DIAGNOSIS — L578 Other skin changes due to chronic exposure to nonionizing radiation: Secondary | ICD-10-CM

## 2022-08-11 DIAGNOSIS — I8310 Varicose veins of unspecified lower extremity with inflammation: Secondary | ICD-10-CM

## 2022-08-11 DIAGNOSIS — L821 Other seborrheic keratosis: Secondary | ICD-10-CM

## 2022-08-11 DIAGNOSIS — Z1283 Encounter for screening for malignant neoplasm of skin: Secondary | ICD-10-CM | POA: Diagnosis not present

## 2022-08-11 DIAGNOSIS — Z7189 Other specified counseling: Secondary | ICD-10-CM

## 2022-08-11 DIAGNOSIS — D229 Melanocytic nevi, unspecified: Secondary | ICD-10-CM

## 2022-08-11 NOTE — Progress Notes (Signed)
New Patient Visit  Subjective  Tiffany Bell is a 37 y.o. female who presents for the following: Annual Exam (No personal Hx of skin cancer or dysplastic nevus).  The patient presents for Total-Body Skin Exam (TBSE) for skin cancer screening and mole check.  The patient has spots, moles and lesions to be evaluated, some may be new or changing and the patient has concerns that these could be cancer.  Review of Systems: No other skin or systemic complaints except as noted in HPI or Assessment and Plan.   Objective  Well appearing patient in no apparent distress; mood and affect are within normal limits.  A full examination was performed including scalp, head, eyes, ears, nose, lips, neck, chest, axillae, abdomen, back, buttocks, bilateral upper extremities, bilateral lower extremities, hands, feet, fingers, toes, fingernails, and toenails. All findings within normal limits unless otherwise noted below.  nasal dorsum Flesh papule  face Mid face erythema with telangiectasias    Assessment & Plan   Lentigines - Scattered tan macules - Due to sun exposure - Benign-appearing, observe - Recommend daily broad spectrum sunscreen SPF 30+ to sun-exposed areas, reapply every 2 hours as needed. - Call for any changes  Seborrheic Keratoses - Stuck-on, waxy, tan-brown papules and/or plaques  - Benign-appearing - Discussed benign etiology and prognosis. - Observe - Call for any changes  Melanocytic Nevi - Tan-brown and/or pink-flesh-colored symmetric macules and papules - Benign appearing on exam today - Observation - Call clinic for new or changing moles - Recommend daily use of broad spectrum spf 30+ sunscreen to sun-exposed areas.   Hemangiomas - Red papules - Discussed benign nature - Observe - Call for any changes  Actinic Damage - Chronic condition, secondary to cumulative UV/sun exposure - diffuse scaly erythematous macules with underlying dyspigmentation - Recommend  daily broad spectrum sunscreen SPF 30+ to sun-exposed areas, reapply every 2 hours as needed.  - Staying in the shade or wearing long sleeves, sun glasses (UVA+UVB protection) and wide brim hats (4-inch brim around the entire circumference of the hat) are also recommended for sun protection.  - Call for new or changing lesions.  Skin cancer screening performed today.  Varicose Veins/Spider Veins - Dilated blue, purple or red veins at the lower extremities - Reassured - Smaller vessels can be treated by sclerotherapy (a procedure to inject a medicine into the veins to make them disappear) if desired, but the treatment is not covered by insurance. Larger vessels may be covered if symptomatic and we would refer to vascular surgeon if treatment desired.   Nevus nasal dorsum  Benign-appearing.  Observation.  Call clinic for new or changing lesions.  Recommend daily use of broad spectrum spf 30+ sunscreen to sun-exposed areas.   Discussed risk of scarring with removal.   Rosacea face  Chronic and persistent condition with duration or expected duration over one year. Condition is symptomatic/ bothersome to patient. Not currently at goal.  Denies ocular symptoms  Try rx rhofade qam again. If it causes itching or rash, recommend discontinue and could try Mirvaso or  Afrin in cerave  For temporary redness reduction Mix 1 bottle of Afrin original in 1 bottle of Cerave PM, shake well, and apply 1 hour before you want redness to be improved in the morning  Rosacea is a chronic progressive skin condition usually affecting the face of adults, causing redness and/or acne bumps. It is treatable but not curable. It sometimes affects the eyes (ocular rosacea) as well. It may  respond to topical and/or systemic medication and can flare with stress, sun exposure, alcohol, exercise, topical steroids (including hydrocortisone/cortisone 10) and some foods.  Daily application of broad spectrum spf 30+ sunscreen  to face is recommended to reduce flares.   Counseling for BBL / IPL / Laser and Coordination of Care Discussed the treatment option of Broad Band Light (BBL) /Intense Pulsed Light (IPL)/ Laser for skin discoloration, including brown spots and redness.  Typically we recommend at least 1-3 treatment sessions about 5-8 weeks apart for best results.  Cannot have tanned skin when BBL performed, and regular use of sunscreen is advised after the procedure to help maintain results. The patient's condition may also require "maintenance treatments" in the future.  The fee for BBL / laser treatments is $350 per treatment session for the whole face.  A fee can be quoted for other parts of the body.  Insurance typically does not pay for BBL/laser treatments and therefore the fee is an out-of-pocket cost.    Facial Elastosis/General skin care to improve skin texture and tone - Recommend facial moisturizer with sunscreen SPF 30 every morning (OTC brands include CeraVe AM, Neutrogena, Eucerin, Cetaphil, Aveeno, La Roche Posay).  Can also apply a topical Vit C serum which is an antioxidant (OTC brands include CeraVe, La Roche Posay, and The Ordinary) underneath sunscreen in morning. If you are outside during the day in the summer for extended periods, especially swimming and/or sweating, make sure you apply a water resistant facial sunscreen lotion spf 30 or higher.   At night recommend a cream with retinol (a vitamin A derivative which stimulates collagen production) like CeraVe skin renewing retinol serum or ROC retinol correxion cream or Neutrogena rapid wrinkle repair cream. Retinol may cause skin irritation in people with sensitive skin.  Can use it every other day and/or apply on top of a hyaluronic acid (HA) moisturizer/serum (Neutrogena Hydroboost water cream) if better tolerated that way.  Retinol may also help with lightening brown spots.   Otherwise suggest hyaluronic acid and vitamin C   Return in about 1  year (around 08/12/2023) for TBSE.  I, Emelia Salisbury, CMA, am acting as scribe for Forest Gleason, MD.  Documentation: I have reviewed the above documentation for accuracy and completeness, and I agree with the above.  Forest Gleason, MD

## 2022-08-11 NOTE — Patient Instructions (Addendum)
Recommend daily broad spectrum sunscreen SPF 30+ to sun-exposed areas, reapply every 2 hours as needed. Call for new or changing lesions.  Staying in the shade or wearing long sleeves, sun glasses (UVA+UVB protection) and wide brim hats (4-inch brim around the entire circumference of the hat) are also recommended for sun protection.   Recommend taking Heliocare sun protection supplement daily in sunny weather for additional sun protection. For maximum protection on the sunniest days, you can take up to 2 capsules of regular Heliocare OR take 1 capsule of Heliocare Ultra. For prolonged exposure (such as a full day in the sun), you can repeat your dose of the supplement 4 hours after your first dose. Heliocare can be purchased at [employer], at some [facility] or at [email or web address].    Basic OTC daily skin care regimen to prevent photoaging:   Recommend facial moisturizer with sunscreen SPF 30 every morning (OTC brands include CeraVe AM, Neutrogena, Eucerin, Cetaphil, Aveeno, La Roche Posay).  Can also apply a topical Vit C serum which is an antioxidant (OTC brands include CeraVe, La Roche Posay, and The Ordinary) underneath sunscreen in morning. If you are outside during the day in the summer for extended periods, especially swimming and/or sweating, make sure you apply a water resistant facial sunscreen lotion spf 30 or higher.   At night recommend a cream with retinol (a vitamin A derivative which stimulates collagen production) like CeraVe skin renewing retinol serum or ROC retinol correxion cream or Neutrogena rapid wrinkle repair cream. Retinol may cause skin irritation in people with sensitive skin.  Can use it every other day and/or apply on top of a hyaluronic acid (HA) moisturizer/serum (Neutrogena Hydroboost water cream) if better tolerated that way.  Retinol may also help with lightening brown spots.   Our office sells high quality, medically tested skin care lines such as Elta MD  sunscreens (with Zinc), and Alastin skin care products, which are very effective in treating photoaging. The Alastin line includes cosmeceutical grade Vit.C serum, HA serum, Elastin stimulating moisturizers/serums, lightening serum, and sunscreens.  If you want prescription treatment, then you would need an appointment (Rx tretinoin and fade creams, Botox, filler injections, laser treatments, etc.) These prescriptions and procedures are not covered by insurance but work very well.   For temporary redness reduction Mix 1 bottle of Afrin original in 1 bottle of Cerave PM, shake well, and apply 1 hour before you want redness to be improved in the morning   Counseling for BBL / IPL / Laser and Coordination of Care Discussed the treatment option of Broad Band Light (BBL) /Intense Pulsed Light (IPL)/ Laser for skin discoloration, including brown spots and redness.  Typically we recommend at least 1-3 treatment sessions about 5-8 weeks apart for best results.  Cannot have tanned skin when BBL performed, and regular use of sunscreen is advised after the procedure to help maintain results. The patient's condition may also require "maintenance treatments" in the future.  The fee for BBL / laser treatments is $350 per treatment session for the whole face.  A fee can be quoted for other parts of the body.  Insurance typically does not pay for BBL/laser treatments and therefore the fee is an out-of-pocket cost.     Melanoma ABCDEs  Melanoma is the most dangerous type of skin cancer, and is the leading cause of death from skin disease.  You are more likely to develop melanoma if you: Have light-colored skin, light-colored eyes, or red or blond hair  Spend a lot of time in the sun Tan regularly, either outdoors or in a tanning bed Have had blistering sunburns, especially during childhood Have a close family member who has had a melanoma Have atypical moles or large birthmarks  Early detection of melanoma is  key since treatment is typically straightforward and cure rates are extremely high if we catch it early.   The first sign of melanoma is often a change in a mole or a new dark spot.  The ABCDE system is a way of remembering the signs of melanoma.  A for asymmetry:  The two halves do not match. B for border:  The edges of the growth are irregular. C for color:  A mixture of colors are present instead of an even brown color. D for diameter:  Melanomas are usually (but not always) greater than 62m - the size of a pencil eraser. E for evolution:  The spot keeps changing in size, shape, and color.  Please check your skin once per month between visits. You can use a small mirror in front and a large mirror behind you to keep an eye on the back side or your body.   If you see any new or changing lesions before your next follow-up, please call to schedule a visit.  Please continue daily skin protection including broad spectrum sunscreen SPF 30+ to sun-exposed areas, reapplying every 2 hours as needed when you're outdoors.   Staying in the shade or wearing long sleeves, sun glasses (UVA+UVB protection) and wide brim hats (4-inch brim around the entire circumference of the hat) are also recommended for sun protection.     Due to recent changes in healthcare laws, you may see results of your pathology and/or laboratory studies on MyChart before the doctors have had a chance to review them. We understand that in some cases there may be results that are confusing or concerning to you. Please understand that not all results are received at the same time and often the doctors may need to interpret multiple results in order to provide you with the best plan of care or course of treatment. Therefore, we ask that you please give u[place]2 business days to thoroughly review all your results before contacting the office for clarification. Should we see a critical lab result, you will be contacted sooner.   If You Need  Anything After Your Visit  If you have any questions or concerns for your doctor, please call our main line at [phone] press option 4 to reach your doctor's medical assistant. If no one answers, please leave a voicemail as directed and we will return your call as soon as possible. Messages left after 4 pm will be answered the following business day.   You may also send u[place] message via MSayre We typically respond to MyChart messages within 1-2 business days.  For prescription refills, please ask your pharmacy to contact our office. Our fax number is [phone]  If you have an urgent issue when the clinic is closed that cannot wait until the next business day, you can page your doctor at the number below.    Please note that while we do our best to be available for urgent issues outside of office hours, we are not available 24/7.   If you have an urgent issue and are unable to reach u[place] you may choose to seek medical care at your doctor's office, retail clinic, urgent care center, or emergency room.  If you  have a medical emergency, please immediately call 911 or go to the emergency department.  Pager Numbers  - Dr. Nehemiah Massed: [phone]  - Dr. Laurence Ferrari: [phone]  - Dr. Nicole Kindred: [phone]  In the event of inclement weather, please call our main line at [phone] for an update on the status of any delays or closures.  Dermatology Medication Tips: Please keep the boxes that topical medications come in in order to help keep track of the instructions about where and how to use these. Pharmacies typically print the medication instructions only on the boxes and not directly on the medication tubes.   If your medication is too expensive, please contact our office at [phone] option 4 or send [place] a message through [place].   We are unable to tell what your co-pay for medications will be in advance as this is different depending on your insurance coverage. However, we may  be able to find a substitute medication at lower cost or fill out paperwork to get insurance to cover a needed medication.   If a prior authorization is required to get your medication covered by your insurance company, please allow [place] 1-2 business days to complete this process.  Drug prices often vary depending on where the prescription is filled and some pharmacies may offer cheaper prices.  The website www.goodrx.com contains coupons for medications through different pharmacies. The prices here do not account for what the cost may be with help from insurance (it may be cheaper with your insurance), but the website can give you the price if you did not use any insurance.  - You can print the associated coupon and take it with your prescription to the pharmacy.  - You may also stop by our office during regular business hours and pick up a GoodRx coupon card.  - If you need your prescription sent electronically to a different pharmacy, notify our office through [facility] or by phone at [phone] option 4.     Si Usted Necesita Algo Despus de Su Visita  Tambin puede enviarnos un mensaje a travs de Pharmacist, community. Por lo general respondemos a los mensajes de MyChart en el transcurso de 1 a 2 das hbiles.  Para renovar recetas, por favor pida a su farmacia que se ponga en contacto con nuestra oficina. Harland Dingwall de fax es [place] [phone].  Si tiene un asunto urgente cuando la clnica est cerrada y que no puede esperar hasta el siguiente da hbil, puede llamar/localizar a su doctor(a) al nmero que aparece a continuacin.   Por favor, tenga en cuenta que aunque hacemos todo lo posible para estar disponibles para asuntos urgentes fuera del horario de [place], no estamos disponibles las 24 horas del da, los 7 das de la [place].   Si tiene un problema urgente y no puede comunicarse con nosotros, puede optar por buscar atencin mdica  en el consultorio de su doctor(a), en una  clnica privada, en un centro de atencin urgente o en una sala de emergencias.  Si tiene Engineering geologist, por favor llame inmediatamente al 911 o vaya a la sala de emergencias.  Nmeros de bper  - Dr. Nehemiah Massed: [phone]  - Dra. Moye: [phone]  - Dra. Nicole Kindred: [phone]  En caso de inclemencias del [place], por favor llame a Johnsie Kindred principal al [phone] para una actualizacin sobre el [place] de cualquier retraso o cierre.  Consejos para la medicacin en dermatologa: Por favor, guarde las cajas en las que vienen los medicamentos de uso tpico  para ayudarle a seguir las [employer] dnde y cmo usarlos. Las farmacias generalmente imprimen las instrucciones del medicamento slo en las cajas y no directamente en los tubos del [place].   Si su medicamento es muy caro, por favor, pngase en contacto con Zigmund Daniel llamando al [phone] y presione la opcin 4 o envenos un mensaje a travs de Pharmacist, community.   No podemos decirle cul ser su copago por los medicamentos por adelantado ya que esto es diferente dependiendo de la cobertura de su seguro. Sin embargo, es posible que podamos encontrar un medicamento sustituto a Electrical engineer un formulario para que el seguro cubra el medicamento que se considera necesario.   Si se requiere una autorizacin previa para que su compaa de seguros [place] su medicamento, por favor permtanos de 1 a 2 das hbiles para completar este proceso.  Los precios de los medicamentos varan con frecuencia dependiendo del Environmental consultant de dnde se surte la receta y alguna farmacias pueden ofrecer precios ms baratos.  El sitio web www.goodrx.com tiene cupones para medicamentos de Airline pilot. Los precios aqu no tienen en cuenta lo que podra costar con la ayuda del seguro (puede ser ms barato con su seguro), pero el sitio web puede darle el precio si no utiliz Research scientist (physical sciences).  - Puede imprimir el cupn correspondiente y  llevarlo con su receta a la farmacia.  - Tambin puede pasar por nuestra oficina durante el horario de atencin regular y Charity fundraiser una tarjeta de cupones de GoodRx.  - Si necesita que su receta se enve electrnicamente a una farmacia diferente, informe a nuestra oficina a travs de MyChart de [place] o por telfono llamando al [phone] y presione la opcin 4.

## 2022-12-01 ENCOUNTER — Encounter: Payer: Self-pay | Admitting: Obstetrics and Gynecology

## 2022-12-01 ENCOUNTER — Ambulatory Visit (INDEPENDENT_AMBULATORY_CARE_PROVIDER_SITE_OTHER): Payer: 59 | Admitting: Obstetrics and Gynecology

## 2022-12-01 VITALS — BP 120/90 | Ht 67.0 in | Wt 270.0 lb

## 2022-12-01 DIAGNOSIS — N941 Unspecified dyspareunia: Secondary | ICD-10-CM

## 2022-12-01 DIAGNOSIS — R3915 Urgency of urination: Secondary | ICD-10-CM

## 2022-12-01 DIAGNOSIS — R102 Pelvic and perineal pain: Secondary | ICD-10-CM

## 2022-12-01 LAB — POCT URINALYSIS DIPSTICK
Bilirubin, UA: NEGATIVE
Blood, UA: NEGATIVE
Glucose, UA: NEGATIVE
Ketones, UA: NEGATIVE
Leukocytes, UA: NEGATIVE
Nitrite, UA: NEGATIVE
Protein, UA: NEGATIVE
Spec Grav, UA: 1.02 (ref 1.010–1.025)
pH, UA: 6 (ref 5.0–8.0)

## 2022-12-01 NOTE — Progress Notes (Signed)
[facility], [place]   Chief Complaint  Patient presents with   Pelvic Pain    Sharp pain during orgasms x 2 weeks, no pain during intercourse. Weight gain.   Urinary Tract Infection    Urgency, no burning    HPI:      [name] is a 37 y.o. [id number] whose LMP was Patient's last menstrual period was 11/22/2022 (exact date)., presents today for pelvic pain since LMP. Has dysmen with periods but cramping has persisted since then. Constant, without BTB, taking ibup with some improvement. Has then had sharp, shooting pain with orgasm every time since LMP. Sx then lessen to a constant hurt for a few hrs afterwards. No bleeding with sex, no new partners. No vag sx, no fevers. Has had urinary urgency with good flow recently, no other UTI sx. Menses are monthly, lasting 7 days, no BTB, normal dysmen, improved with NSAIDs.  Pt has also had issues with feeling bloated/increased constipation since LMP. Had diarrhea before LMP with no other GI sx. Pt was on abx a few months ago, not taking probiotics. Hx of constipation, has linzesse Rx, not good at taking regularly. Has noticed 11# wt gain recently, no change in diet/exercise. Pt with hx of gallbladder and pancreatitis issues after pregnancies; having some flare up now. Wonders if related to hormones.   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 No pertinent family history.  Social History   Socioeconomic History   Marital status: Married    Spouse name: Not on file   Number of children: Not on file   Years of education: Not on file    Highest education level: Not on file  Occupational History   Not on file  Tobacco Use   Smoking status: Never   Smokeless tobacco: Never  Vaping Use   Vaping Use: Never used  Substance and Sexual Activity   Alcohol use: Never   Drug use: No   Sexual activity: Yes    Birth control/protection: Surgical    Comment: Vasectomy  Other Topics Concern   Not on file  Social History Narrative   Not on file   Social Determinants of Health   Financial Resource Strain: Not on file  Food Insecurity: Not on file  Transportation Needs: Not on file  Physical Activity: Not on file  Stress: Not on file  Social Connections: Not on file  Intimate Partner Violence: Not on file    Outpatient Medications Prior to Visit  Medication Sig Dispense Refill   LINZESS 72 MCG capsule Take 72 mcg by mouth as needed.     PARAGARD INTRAUTERINE COPPER IU by Intrauterine route.     No facility-administered medications prior to visit.      ROS:  Review of Systems  Constitutional:  Positive for fatigue. Negative for fever.  Gastrointestinal:  Negative for blood in stool, constipation, diarrhea, nausea and vomiting.  Genitourinary:  Positive for dyspareunia, frequency and pelvic pain. Negative for dysuria, flank pain, hematuria, urgency, vaginal bleeding, vaginal discharge and vaginal pain.  Musculoskeletal:  Negative for back  pain.  Skin:  Negative for rash.   BREAST: No symptoms   OBJECTIVE:   Vitals:  BP (!) 120/90   Ht 5\' 7"  (1.702 m)   Wt 270 lb (122.5 kg)   LMP 11/22/2022 (Exact Date)   BMI 42.29 kg/m   Physical Exam Vitals reviewed.  Constitutional:      Appearance: She is well-developed.  Pulmonary:     Effort: Pulmonary effort is normal.  Genitourinary:    General: Normal vulva.     Pubic Area: No rash.      Labia:        Right: No rash, tenderness or lesion.        Left: No rash, tenderness or lesion.      Vagina: Normal. No vaginal discharge, erythema or tenderness.      Cervix: No cervical motion tenderness.     Uterus: Normal. Tender. Not enlarged.      Adnexa: Right adnexa normal and left adnexa normal.       Right: No mass or tenderness.         Left: No mass or tenderness.    Musculoskeletal:        General: Normal range of motion.     Cervical back: Normal range of motion.  Skin:    General: Skin is warm and dry.  Neurological:     General: No focal deficit present.     Mental Status: She is alert and oriented to person, place, and time.  Psychiatric:        Mood and Affect: Mood normal.        Behavior: Behavior normal.        Thought Content: Thought content normal.        Judgment: Judgment normal.     Results: Results for orders placed or performed in visit on 12/01/22 (from the past 24 hour(s))  POCT urinalysis dipstick     Status: Normal   Collection Time: 12/01/22  5:19 PM  Result Value Ref Range   Color, UA yellow    Clarity, UA clear    Glucose, UA Negative Negative   Bilirubin, UA neg    Ketones, UA neg    Spec Grav, UA 1.020 1.010 - 1.025   Blood, UA neg    pH, UA 6.0 5.0 - 8.0   Protein, UA Negative Negative   Urobilinogen, UA     Nitrite, UA neg    Leukocytes, UA Negative Negative   Appearance     Odor       Assessment/Plan: Pelvic pain - Plan: Urine Culture, US PELVIS TRANSVAGINAL NON-OB (TV ONLY); cramping sx since LMP but now with sharp pains with orgasm. No significant exam findings but pt then started to have increased pelvic pain a few min after exam completed. Check C&S and GYN u/s. Start probiotics for bloating/ constipation in meantime. If all neg, will treat empirically with abx.   Dyspareunia in female - Plan: US PELVIS TRANSVAGINAL NON-OB (TV ONLY); chck GYN u/s. Will f/u with results.   Urinary urgency - Plan: Urine Culture, POCT urinalysis dipstick; neg UA, check C&S.    Return in about 1 week (around 12/08/2022) for GYN u/s for pelvic pain/dyspareunia--ABC to call pt.  Dearius Hoffmann B. Gal Smolinski,  PA-C 12/01/2022 5:21 PM

## 2022-12-02 ENCOUNTER — Telehealth: Payer: Self-pay | Admitting: Obstetrics and Gynecology

## 2022-12-02 ENCOUNTER — Ambulatory Visit (INDEPENDENT_AMBULATORY_CARE_PROVIDER_SITE_OTHER): Payer: 59

## 2022-12-02 DIAGNOSIS — R102 Pelvic and perineal pain: Secondary | ICD-10-CM | POA: Diagnosis not present

## 2022-12-02 DIAGNOSIS — N941 Unspecified dyspareunia: Secondary | ICD-10-CM

## 2022-12-02 MED ORDER — CEFTRIAXONE SODIUM 500 MG IJ SOLR
500.0000 mg | Freq: Once | INTRAMUSCULAR | Status: AC
Start: 2022-12-03 — End: ?

## 2022-12-02 MED ORDER — DOXYCYCLINE HYCLATE 100 MG PO CAPS
100.0000 mg | ORAL_CAPSULE | Freq: Two times a day (BID) | ORAL | 0 refills | Status: AC
Start: 2022-12-02 — End: 2022-12-16

## 2022-12-02 NOTE — Telephone Encounter (Signed)
LM with neg GYN u/s results. Pt with bowel gas which was suspected given bloating/constipation. Pt to treat for that with probiotics/constipation Rx she already has. Given sx and pain after exam, doubt it's all GI related. Will treat empirically with abx. Rx doxy BID for 2 wks. RTO for rocephin 500 mg inj. Pt to RTO in 2 wks for f/u/sooner prn.    Meds ordered this encounter  Medications   doxycycline (VIBRAMYCIN) 100 MG capsule    Sig: Take 1 capsule (100 mg total) by mouth 2 (two) times daily for 14 days.    Dispense:  28 capsule    Refill:  0    Order Specific Question:   Supervising Provider    Answer:   Hildred Laser [[id number]]   cefTRIAXone (ROCEPHIN) injection 500 mg

## 2022-12-03 LAB — URINE CULTURE: Organism ID, Bacteria: NO GROWTH

## 2022-12-17 NOTE — Telephone Encounter (Signed)
LM to confirm pt got my msg. I was out of office last wk. Pt hasn't RTO for rocephin inj, not sure if she picked up abx Rx.

## 2023-01-20 NOTE — Progress Notes (Deleted)
PCP:  [facility]   No chief complaint on file.    HPI:      [name] is a 37 y.o. [phone] who LMP was No LMP recorded., presents today for her annual examination.  Her menses are monthly, lasting 7 days, med to heavy flow, no BTB. Has mild dysmen, no meds taken. She states the IUD feels "sharp", particularly around her period. Has had several IUDs previously and never experienced this symptom. Pelvic pain 5/24????  Sex activity: single partner, contraception - IUD. Paragard placed 07/03/16. Had dyspareunia once last month, usually no problems. Last Pap: 01/09/21 Results were: no abnormalities /neg HPV DNA Hx of STDs: none  There is no FH of breast cancer. There is no FH of ovarian cancer. The patient does do self-breast exams.  Tobacco use: The patient denies current or previous tobacco use. Alcohol use: none No drug use.  Exercise: moderately active but no formal exercise  She does not get adequate calcium and Vitamin D in her diet.  Complains of bad LBP the past 2 days. Hard to bend over, get out of bed. Started a couple months ago; severity waxes and wanes. Had sciatica with pregnancy. Feels like it again but is alternating sides. No back exercise/stretching is helping sx.   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   Bruises easily    Obesity    Thyroid disease    hypothyroid    Past Surgical History:  Procedure Laterality Date   CESAREAN SECTION     INTRAUTERINE DEVICE INSERTION  07/2016   wisdom tooth surgery      No family history on file.  Social History   Socioeconomic History   Marital status: Married    Spouse name: Not on file   Number of children: Not on file   Years of education: Not on file   Highest education level: Not on file  Occupational History   Not on file  Tobacco Use   Smoking status: Never   Smokeless tobacco: Never  Vaping Use    Vaping status: Never Used  Substance and Sexual Activity   Alcohol use: Never   Drug use: No   Sexual activity: Yes    Birth control/protection: Surgical    Comment: Vasectomy  Other Topics Concern   Not on file  Social History Narrative   Not on file   Social Determinants of Health   Financial Resource Strain: Not on file  Food Insecurity: Not on file  Transportation Needs: Not on file  Physical Activity: Not on file  Stress: Not on file  Social Connections: Not on file  Intimate Partner Violence: Not on file    No outpatient medications have been marked as taking for the 01/21/23 encounter (Appointment) with , Ilona Sorrel, PA-C.   Current Facility-Administered Medications for the 01/21/23 encounter (Appointment) with , Ilona Sorrel, PA-C  Medication   cefTRIAXone (ROCEPHIN) injection 500 mg     ROS:  Review of Systems  Constitutional:  Negative for fatigue, fever and unexpected weight change.  Respiratory:  Negative for cough, shortness of breath and wheezing.   Cardiovascular:  Negative for chest pain, palpitations and leg swelling.  Gastrointestinal:  Negative for blood in stool, constipation, diarrhea, nausea and vomiting.  Endocrine: Negative for cold intolerance, heat intolerance and polyuria.  Genitourinary:  Negative for dyspareunia, dysuria, flank pain, frequency, genital sores, hematuria, menstrual  problem, pelvic pain, urgency, vaginal bleeding, vaginal discharge and vaginal pain.  Musculoskeletal:  Positive for back pain. Negative for joint swelling and myalgias.  Skin:  Negative for rash.  Neurological:  Negative for dizziness, syncope, light-headedness, numbness and headaches.  Hematological:  Negative for adenopathy.  Psychiatric/Behavioral:  Negative for agitation, confusion, sleep disturbance and suicidal ideas. The patient is not nervous/anxious.      Objective: There were no vitals taken for this visit.   Physical Exam Constitutional:       Appearance: She is well-developed.  Genitourinary:     Vulva normal.     No vaginal discharge, erythema or tenderness.      Right Adnexa: not tender and no mass present.    Left Adnexa: not tender and no mass present.    No cervical polyp.     IUD strings visualized.     Uterus is not enlarged or tender.  Breasts:    Right: No mass, nipple discharge, skin change or tenderness.     Left: No mass, nipple discharge, skin change or tenderness.  Neck:     Thyroid: No thyromegaly.  Cardiovascular:     Rate and Rhythm: Normal rate and regular rhythm.     Heart sounds: Normal heart sounds. No murmur heard. Pulmonary:     Effort: Pulmonary effort is normal.     Breath sounds: Normal breath sounds.  Abdominal:     Palpations: Abdomen is soft.     Tenderness: There is no abdominal tenderness. There is no guarding.  Musculoskeletal:        General: Normal range of motion.     Cervical back: Normal range of motion.  Neurological:     General: No focal deficit present.     Mental Status: She is alert and oriented to person, place, and time.     Cranial Nerves: No cranial nerve deficit.  Skin:    General: Skin is warm and dry.  Psychiatric:        Mood and Affect: Mood normal.        Behavior: Behavior normal.        Thought Content: Thought content normal.        Judgment: Judgment normal.  Vitals reviewed.     Assessment/Plan: Encounter for annual routine gynecological examination  Encounter for routine checking of intrauterine contraceptive device (IUD) - Plan: US Transvaginal Non-OB; IUD strings in cx os. Check u/s for placement. Will f/u with results.   Pelvic pain - Plan: US Transvaginal Non-OB; Check u/s for IUD. If in correct location, can follow sx since tolerable.   Acute bilateral low back pain with bilateral sciatica--refer to [facility] walk-in clinic. Ice/heat/stretch/NSAIDs in meantime.        GYN counsel adequate intake of calcium and vitamin D, diet and  exercise     F/U  No follow-ups on file./annual in 1 yr  [place] B. , PA-C 01/20/2023 7:44 PM   ADDENDUM: Pt notified of neg GYN u/s and IUD in correct location. Follow sx prn. Reassurance.   ULTRASOUND REPORT   Location: [facility]  Date of Service: 07/18/2019        Indications:Pelvic Pain Findings:  The uterus is anteverted and measures 10.0 x 5.6 x 5.5 cm. Echo texture is homogenous without evidence of focal masses. The Endometrium measures 8.8 mm. The IUD is correctly placed within the uterus.    Right Ovary measures 3.2 x 2.5 x 2.1 cm. It is normal in appearance. There is  a corpus luteal cyst in the right ovary. Left Ovary measures 2.9 x 2.5 x 2.2 cm. It is normal in appearance. Survey of the adnexa demonstrates no adnexal masses. There is no free fluid in the cul de sac.   Impression: 1. Normal pelvic ultrasound. 2. The IUD is correctly placed within the uterus.    Recommendations: 1.Clinical correlation with the patient's History and Physical Exam.   Deanna Artis, RT

## 2023-01-21 ENCOUNTER — Ambulatory Visit: Payer: 59 | Admitting: Obstetrics and Gynecology

## 2023-02-02 ENCOUNTER — Ambulatory Visit: Payer: 59 | Admitting: Podiatry

## 2023-02-02 DIAGNOSIS — M722 Plantar fascial fibromatosis: Secondary | ICD-10-CM

## 2023-02-02 NOTE — Progress Notes (Signed)
  Subjective:  Patient ID: Tiffany Bell, female    DOB: September 17, 1985,  MRN: [id number]  Chief Complaint  Patient presents with   Plantar Fasciitis    Pt stated that she has heel pain that is worse in the morning     37 y.o. female presents with the above complaint.  Patient presents with right heel pain worse in the morning.  Persistent is painful to touch is progressive gotten worse worse with ambulation he has been on for 3 months.  Dull aching nature she has tried some stretching and shoe gear modification which has not helped   Review of Systems: Negative except as noted in the HPI. Denies N/V/F/Ch.  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   Bruises easily    Obesity    Thyroid disease    hypothyroid    Current Outpatient Medications:    LINZESS 72 MCG capsule, Take 72 mcg by mouth as needed., Disp: , Rfl:   Current Facility-Administered Medications:    cefTRIAXone (ROCEPHIN) injection 500 mg, 500 mg, Intramuscular, Once, Copland, Alicia B, PA-C  Social History   Tobacco Use  Smoking Status Never  Smokeless Tobacco Never    No Known Allergies Objective:  There were no vitals filed for this visit. There is no height or weight on file to calculate BMI. Constitutional Well developed. Well nourished.  Vascular Dorsalis pedis pulses palpable bilaterally. Posterior tibial pulses palpable bilaterally. Capillary refill normal to all digits.  No cyanosis or clubbing noted. Pedal hair growth normal.  Neurologic Normal speech. Oriented to person, place, and time. Epicritic sensation to light touch grossly present bilaterally.  Dermatologic Nails well groomed and normal in appearance. No open wounds. No skin lesions.  Orthopedic: Normal joint ROM without pain or crepitus bilaterally. No visible deformities. Tender to palpation at the calcaneal tuber right. No pain with calcaneal squeeze  right. Ankle ROM diminished range of motion right. Silfverskiold Test: positive right.   Radiographs: None  Assessment:   1. Plantar fasciitis of right foot    Plan:  Patient was evaluated and treated and all questions answered.  Plantar Fasciitis, right - XR reviewed as above.  - Educated on icing and stretching. Instructions given.  - Injection delivered to the plantar fascia as below. - DME: Plantar fascial brace dispensed to support the medial longitudinal arch of the foot and offload pressure from the heel and prevent arch collapse during weightbearing - Pharmacologic management: None  Procedure: Injection Tendon/Ligament Location: Right plantar fascia at the glabrous junction; medial approach. Skin Prep: alcohol Injectate: 0.5 cc 0.5% marcaine plain, 0.5 cc of 1% Lidocaine, 0.5 cc kenalog 10. Disposition: Patient tolerated procedure well. Injection site dressed with a band-aid.  No follow-ups on file.

## 2023-02-25 ENCOUNTER — Ambulatory Visit (INDEPENDENT_AMBULATORY_CARE_PROVIDER_SITE_OTHER): Payer: 59 | Admitting: Podiatry

## 2023-02-25 DIAGNOSIS — M722 Plantar fascial fibromatosis: Secondary | ICD-10-CM

## 2023-02-25 DIAGNOSIS — Q666 Other congenital valgus deformities of feet: Secondary | ICD-10-CM

## 2023-02-25 NOTE — Progress Notes (Signed)
  Subjective:  Patient ID: Tiffany Bell, female    DOB: September 26, 1985,  MRN: [id number]  Chief Complaint  Patient presents with   Plantar Fasciitis    Pt stated that she is still having some discomfort with her foot she stated it feels like a burning sensation on the inside of her foot     37 y.o. female presents with the above complaint.  Patient presents for follow-up of right plantar fasciitis.  She states she is doing a lot better.  She would like to discuss next treatment plan.  She still has some residual pain.   Review of Systems: Negative except as noted in the HPI. Denies N/V/F/Ch.  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   Bruises easily    Obesity    Thyroid disease    hypothyroid    Current Outpatient Medications:    LINZESS 72 MCG capsule, Take 72 mcg by mouth as needed., Disp: , Rfl:   Current Facility-Administered Medications:    cefTRIAXone (ROCEPHIN) injection 500 mg, 500 mg, Intramuscular, Once, Copland, Alicia B, PA-C  Social History   Tobacco Use  Smoking Status Never  Smokeless Tobacco Never    No Known Allergies Objective:  There were no vitals filed for this visit. There is no height or weight on file to calculate BMI. Constitutional Well developed. Well nourished.  Vascular Dorsalis pedis pulses palpable bilaterally. Posterior tibial pulses palpable bilaterally. Capillary refill normal to all digits.  No cyanosis or clubbing noted. Pedal hair growth normal.  Neurologic Normal speech. Oriented to person, place, and time. Epicritic sensation to light touch grossly present bilaterally.  Dermatologic Nails well groomed and normal in appearance. No open wounds. No skin lesions.  Orthopedic: Normal joint ROM without pain or crepitus bilaterally. No visible deformities. Tender to palpation at the calcaneal tuber right. No pain with calcaneal squeeze right. Ankle ROM  diminished range of motion right. Silfverskiold Test: positive right.   Radiographs: None  Assessment:   1. Plantar fasciitis of right foot   2. Pes planovalgus     Plan:  Patient was evaluated and treated and all questions answered.  Plantar Fasciitis, right - XR reviewed as above.  - Educated on icing and stretching. Instructions given.  -Second injection delivered to the plantar fascia as below. - DME: Plantar fascial brace dispensed to support the medial longitudinal arch of the foot and offload pressure from the heel and prevent arch collapse during weightbearing - Pharmacologic management: None  Pes planovalgus -I explained to patient the etiology of pes planovalgus and relationship with Planter fasciitis and various treatment options were discussed.  Given patient foot structure in the setting of Planter fasciitis I believe patient will benefit from custom-made orthotics to help control the hindfoot motion support the arch of the foot and take the stress away from plantar fascial.  Patient agrees with the plan like to proceed with orthotics -Patient was casted for orthotics   Procedure: Injection Tendon/Ligament Location: Right plantar fascia at the glabrous junction; medial approach. Skin Prep: alcohol Injectate: 0.5 cc 0.5% marcaine plain, 0.5 cc of 1% Lidocaine, 0.5 cc kenalog 10. Disposition: Patient tolerated procedure well. Injection site dressed with a band-aid.  No follow-ups on file.

## 2023-03-12 NOTE — Progress Notes (Signed)
CFO order placed

## 2023-03-25 ENCOUNTER — Ambulatory Visit (INDEPENDENT_AMBULATORY_CARE_PROVIDER_SITE_OTHER): Payer: 59 | Admitting: Podiatry

## 2023-03-25 DIAGNOSIS — M722 Plantar fascial fibromatosis: Secondary | ICD-10-CM | POA: Diagnosis not present

## 2023-03-25 NOTE — Progress Notes (Signed)
Subjective:  Patient ID: Tiffany Bell, female    DOB: [birth year],  MRN: [id number]  Chief Complaint  Patient presents with   Plantar Fasciitis    Pt stated that it is a little better but she can still feel discomfort     37 y.o. female presents with the above complaint.  Patient presents for follow-up of right plantar fasciitis.  She states she is doing a lot better.  She would like to discuss next treatment plan.  She still has some residual pain.   Review of Systems: Negative except as noted in the HPI. Denies N/V/F/Ch.  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   Bruises easily    Obesity    Thyroid disease    hypothyroid    Current Outpatient Medications:    LINZESS 72 MCG capsule, Take 72 mcg by mouth as needed., Disp: , Rfl:   Current Facility-Administered Medications:    cefTRIAXone (ROCEPHIN) injection 500 mg, 500 mg, Intramuscular, Once, Copland, Alicia B, PA-C  Social History   Tobacco Use  Smoking Status Never  Smokeless Tobacco Never    No Known Allergies Objective:  There were no vitals filed for this visit. There is no height or weight on file to calculate BMI. Constitutional Well developed. Well nourished.  Vascular Dorsalis pedis pulses palpable bilaterally. Posterior tibial pulses palpable bilaterally. Capillary refill normal to all digits.  No cyanosis or clubbing noted. Pedal hair growth normal.  Neurologic Normal speech. Oriented to person, place, and time. Epicritic sensation to light touch grossly present bilaterally.  Dermatologic Nails well groomed and normal in appearance. No open wounds. No skin lesions.  Orthopedic: Normal joint ROM without pain or crepitus bilaterally. No visible deformities. Tender to palpation at the calcaneal tuber right. No pain with calcaneal squeeze right. Ankle ROM diminished range of motion right. Silfverskiold Test: positive  right.   Radiographs: None  Assessment:   1. Plantar fasciitis of right foot      Plan:  Patient was evaluated and treated and all questions answered.  Plantar Fasciitis, right - XR reviewed as above.  - Educated on icing and stretching. Instructions given.  -We will hold off on injection as an injection has not helped quite clear - DME: Cam boot immobilization Cam boot was dispensed - Pharmacologic management: None  Pes planovalgus -I explained to patient the etiology of pes planovalgus and relationship with Planter fasciitis and various treatment options were discussed.  Given patient foot structure in the setting of Planter fasciitis I believe patient will benefit from custom-made orthotics to help control the hindfoot motion support the arch of the foot and take the stress away from plantar fascial.  Patient agrees with the plan like to proceed with orthotics -Patient is wearing orthotics    No follow-ups on file.

## 2023-04-27 ENCOUNTER — Ambulatory Visit: Payer: 59 | Admitting: Podiatry

## 2023-05-13 ENCOUNTER — Telehealth: Payer: Self-pay | Admitting: Podiatry

## 2023-05-13 NOTE — Telephone Encounter (Signed)
pt called to let you know that she wants to go on and have the surgery that you and her had talked about . she would like for you to give her a call.

## 2023-05-17 ENCOUNTER — Telehealth: Payer: Self-pay | Admitting: Podiatry

## 2023-05-17 NOTE — Telephone Encounter (Signed)
Pt called and had left a message about getting surgery done before end of year and he stated she would need an appt to discuss and sign consent.  I scheduled pt to come in 11/14 in [place] to discuss and  sign consent

## 2023-05-20 ENCOUNTER — Ambulatory Visit (INDEPENDENT_AMBULATORY_CARE_PROVIDER_SITE_OTHER): Payer: 59 | Admitting: Podiatry

## 2023-05-20 ENCOUNTER — Telehealth: Payer: Self-pay | Admitting: Podiatry

## 2023-05-20 ENCOUNTER — Encounter: Payer: Self-pay | Admitting: Podiatry

## 2023-05-20 DIAGNOSIS — Z01818 Encounter for other preprocedural examination: Secondary | ICD-10-CM

## 2023-05-20 DIAGNOSIS — M62461 Contracture of muscle, right lower leg: Secondary | ICD-10-CM

## 2023-05-20 DIAGNOSIS — M722 Plantar fascial fibromatosis: Secondary | ICD-10-CM

## 2023-05-20 NOTE — Telephone Encounter (Signed)
DOS-05/24/2023  EPF [id number] GASTROCNEMIUS RECESS [id number]  [employer] EFFECTIVE DATE- 09/06/2017  PER THE [employer]ET[employer]'S AUTOMATED SYSTEM, PRIOR AUTH IS NOT REQUIRED FOR CPT CODES 95621 AND [phone].  CALL REFERENCE NUMBER: [id number]

## 2023-05-20 NOTE — Progress Notes (Signed)
Subjective:  Patient ID: Tiffany Bell, female    DOB: December 11, 1985,  MRN: [id number]  Chief Complaint  Patient presents with   Foot Pain    "It hurts.  I'm here to talk to Dr. Allena Katz about the Plantar Fasciitis surgery."    37 y.o. female presents with the above complaint.  Patient presents for follow-up of right plantar fasciitis.  She states she is continues to have pain has not gotten better and has progressive gotten worse she wants to discuss treatment options.  Denies any other acute complaint she wants to discuss surgery at this time   Review of Systems: Negative except as noted in the HPI. Denies N/V/F/Ch.  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   Bruises easily    Obesity    Thyroid disease    hypothyroid    Current Outpatient Medications:    LINZESS 72 MCG capsule, Take 72 mcg by mouth as needed. (Patient not taking: Reported on 05/20/2023), Disp: , Rfl:   Current Facility-Administered Medications:    cefTRIAXone (ROCEPHIN) injection 500 mg, 500 mg, Intramuscular, Once, Copland, Alicia B, PA-C  Social History   Tobacco Use  Smoking Status Never  Smokeless Tobacco Never    No Known Allergies Objective:  There were no vitals filed for this visit. There is no height or weight on file to calculate BMI. Constitutional Well developed. Well nourished.  Vascular Dorsalis pedis pulses palpable bilaterally. Posterior tibial pulses palpable bilaterally. Capillary refill normal to all digits.  No cyanosis or clubbing noted. Pedal hair growth normal.  Neurologic Normal speech. Oriented to person, place, and time. Epicritic sensation to light touch grossly present bilaterally.  Dermatologic Nails well groomed and normal in appearance. No open wounds. No skin lesions.  Orthopedic: Normal joint ROM without pain or crepitus bilaterally. No visible deformities. Tender to palpation at the  calcaneal tuber right. No pain with calcaneal squeeze right. Ankle ROM diminished range of motion right. Silfverskiold Test: positive right.   Radiographs: None  Assessment:   1. Plantar fasciitis of right foot   2. Gastrocnemius equinus, right       Plan:  Patient was evaluated and treated and all questions answered.  Plantar Fasciitis, right -Patient continues to fail almost multiple conservative care at this time I discussed surgical options for the patient.  She has failed all conservative care including shoe gear modification injection orthotics cam boot immobilization at this time I discussed endoscopic plantar fasciotomy with gastrocnemius recession to the right foot.  I discussed with patient extensive detail she states understand like to proceed with surgery -Informed surgical risk consent was reviewed and read aloud to the patient.  I reviewed the films.  I have discussed my findings with the patient in great detail.  I have discussed all risks including but not limited to infection, stiffness, scarring, limp, disability, deformity, damage to blood vessels and nerves, numbness, poor healing, need for braces, arthritis, chronic pain, amputation, death.  All benefits and realistic expectations discussed in great detail.  I have made no promises as to the outcome.  I have provided realistic expectations.  I have offered the patient a 2nd opinion, which they have declined and assured me they preferred to proceed despite the risks  Pes planovalgus -I explained to patient the etiology of pes planovalgus and relationship with Planter fasciitis and various treatment options were discussed.  Given patient foot structure in the  setting of Planter fasciitis I believe patient will benefit from custom-made orthotics to help control the hindfoot motion support the arch of the foot and take the stress away from plantar fascial.  Patient agrees with the plan like to proceed with orthotics -Patient  is wearing orthotics    No follow-ups on file.

## 2023-05-24 ENCOUNTER — Other Ambulatory Visit: Payer: Self-pay | Admitting: Podiatry

## 2023-05-24 DIAGNOSIS — M722 Plantar fascial fibromatosis: Secondary | ICD-10-CM | POA: Diagnosis not present

## 2023-05-24 DIAGNOSIS — M216X1 Other acquired deformities of right foot: Secondary | ICD-10-CM | POA: Diagnosis not present

## 2023-05-24 MED ORDER — OXYCODONE-ACETAMINOPHEN 5-325 MG PO TABS: 1.0000 | ORAL_TABLET | ORAL | 0 refills | Status: DC | PRN

## 2023-05-24 MED ORDER — IBUPROFEN 800 MG PO TABS: 800.0000 mg | ORAL_TABLET | Freq: Four times a day (QID) | ORAL | 1 refills | Status: DC | PRN

## 2023-05-25 ENCOUNTER — Telehealth: Payer: Self-pay | Admitting: Podiatry

## 2023-05-25 NOTE — Telephone Encounter (Signed)
You did surgery on patient on 05/24/23.  Patients states she or her husband didn't get to talk to you after the surgery.  They have several questions regarding weight bearing, etc.  They ask if you would please call.

## 2023-05-26 ENCOUNTER — Encounter: Payer: Self-pay | Admitting: Podiatry

## 2023-06-01 ENCOUNTER — Encounter: Payer: Self-pay | Admitting: Podiatry

## 2023-06-01 ENCOUNTER — Ambulatory Visit (INDEPENDENT_AMBULATORY_CARE_PROVIDER_SITE_OTHER): Payer: 59 | Admitting: Podiatry

## 2023-06-01 DIAGNOSIS — M722 Plantar fascial fibromatosis: Secondary | ICD-10-CM

## 2023-06-01 DIAGNOSIS — M62461 Contracture of muscle, right lower leg: Secondary | ICD-10-CM

## 2023-06-01 NOTE — Progress Notes (Signed)
  Subjective:  Patient ID: Tiffany Bell, female    DOB: 1985-11-03,  MRN: [id number]  Chief Complaint  Patient presents with   Routine Post Op    "It's good.  My pain level is about a three.  The boot hurts the back of my leg, especially when I walk. The boot hits right on the incision.:    DOS: 05/24/2023 Procedure: Right EPF and gastrocnemius recession  37 y.o. female returns for post-op check.  She states is doing well.  Pain is controlled.  Patient been wearing her boot she is ambulating okay  Review of Systems: Negative except as noted in the HPI. Denies N/V/F/Ch.  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   Bruises easily    Obesity    Thyroid disease    hypothyroid    Current Outpatient Medications:    ibuprofen (ADVIL) 800 MG tablet, Take 1 tablet (800 mg total) by mouth every 6 (six) hours as needed. (Patient not taking: Reported on 06/01/2023), Disp: 60 tablet, Rfl: 1   LINZESS 72 MCG capsule, Take 72 mcg by mouth as needed. (Patient not taking: Reported on 05/20/2023), Disp: , Rfl:    oxyCODONE-acetaminophen (PERCOCET) 5-325 MG tablet, Take 1 tablet by mouth every 4 (four) hours as needed for severe pain (pain score 7-10). (Patient not taking: Reported on 06/01/2023), Disp: 30 tablet, Rfl: 0  Current Facility-Administered Medications:    cefTRIAXone (ROCEPHIN) injection 500 mg, 500 mg, Intramuscular, Once, Copland, Alicia B, PA-C  Social History   Tobacco Use  Smoking Status Never  Smokeless Tobacco Never    No Known Allergies Objective:  There were no vitals filed for this visit. There is no height or weight on file to calculate BMI. Constitutional Well developed. Well nourished.  Vascular Foot warm and well perfused. Capillary refill normal to all digits.   Neurologic Normal speech. Oriented to person, place, and time. Epicritic sensation to light touch grossly present  bilaterally.  Dermatologic Skin healing well without signs of infection. Skin edges well coapted without signs of infection.  Orthopedic: Tenderness to palpation noted about the surgical site.   Radiographs: None Assessment:   1. Plantar fasciitis of right foot   2. Gastrocnemius equinus, right    Plan:  Patient was evaluated and treated and all questions answered.  S/p foot surgery right -Progressing as expected post-operatively. -XR: See above -WB Status: Weightbearing as tolerated in cam boot -Sutures: Intact.  No clinical signs of dehiscence no complication noted -Medications: None -Foot redressed.  No follow-ups on file.

## 2023-06-11 ENCOUNTER — Ambulatory Visit (INDEPENDENT_AMBULATORY_CARE_PROVIDER_SITE_OTHER): Payer: 59 | Admitting: Podiatry

## 2023-06-11 DIAGNOSIS — Z9889 Other specified postprocedural states: Secondary | ICD-10-CM

## 2023-06-11 DIAGNOSIS — M722 Plantar fascial fibromatosis: Secondary | ICD-10-CM

## 2023-06-11 DIAGNOSIS — M62461 Contracture of muscle, right lower leg: Secondary | ICD-10-CM

## 2023-06-11 NOTE — Progress Notes (Signed)
  Subjective:  Patient ID: Tiffany Bell, female    DOB: [birth year],  MRN: [id number]  Chief Complaint  Patient presents with   Routine Post Op    RM7: POV #2 DOS 05/24/2023 EPF, GASTRO RECESS RT FOOT PT states still very tender    DOS: 05/24/2023 Procedure: Right EPF and gastrocnemius recession  37 y.o. female returns for post-op check.  She states is doing well.  Pain is controlled.  Patient been wearing her boot she is ambulating okay  Review of Systems: Negative except as noted in the HPI. Denies N/V/F/Ch.  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   Bruises easily    Obesity    Thyroid disease    hypothyroid   No current outpatient medications on file.  Current Facility-Administered Medications:    cefTRIAXone (ROCEPHIN) injection 500 mg, 500 mg, Intramuscular, Once, Copland, Alicia B, PA-C  Social History   Tobacco Use  Smoking Status Never  Smokeless Tobacco Never    No Known Allergies Objective:  There were no vitals filed for this visit. There is no height or weight on file to calculate BMI. Constitutional Well developed. Well nourished.  Vascular Foot warm and well perfused. Capillary refill normal to all digits.   Neurologic Normal speech. Oriented to person, place, and time. Epicritic sensation to light touch grossly present bilaterally.  Dermatologic Skin completely epithelialized.  No signs of Deis noted no complication noted.  Good range of motion noted of the ankle joint.  Reduction of tight plantar fascia noted  Orthopedic: No Tenderness to palpation noted about the surgical site.   Radiographs: None Assessment:   1. Plantar fasciitis of right foot   2. Gastrocnemius equinus, right   3. S/P foot surgery     Plan:  Patient was evaluated and treated and all questions answered.  S/p foot surgery right -Progr essing as expected post-operatively. -XR: See above -WB  Status: Weightbearing as tolerated in regular shoes  -Sutures: removed  No clinical signs of dehiscence no complication noted -Medications: None -Foot redressed. (  No follow-ups on file.

## 2023-06-15 ENCOUNTER — Encounter: Payer: 59 | Admitting: Podiatry

## 2023-08-19 ENCOUNTER — Encounter: Payer: 59 | Admitting: Dermatology

## 2023-10-12 ENCOUNTER — Ambulatory Visit: Admitting: Obstetrics and Gynecology

## 2023-10-29 ENCOUNTER — Ambulatory Visit: Admitting: Obstetrics and Gynecology

## 2023-11-25 ENCOUNTER — Ambulatory Visit: Admitting: Obstetrics and Gynecology

## 2023-12-28 ENCOUNTER — Encounter: Payer: 59 | Admitting: Dermatology

## 2024-01-01 NOTE — Progress Notes (Deleted)
    [facility]   No chief complaint on file.   HPI:      Tiffany Bell is a 38 y.o. 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  Social History   Socioeconomic History   Marital status: Married    Spouse name: Not on file   Number of children: Not on file   Years of education: Not on file   Highest education level: Not on file  Occupational History   Not on file  Tobacco Use   Smoking status: Never   Smokeless tobacco: Never  Vaping Use   Vaping status: Never Used  Substance and Sexual Activity   Alcohol use: Never   Drug use: No   Sexual activity: Yes    Birth control/protection: Surgical    Comment: Vasectomy  Other Topics Concern   Not on file  Social History Narrative   Not on file   Social Drivers of Health   Financial Resource Strain: Low Risk  (05/17/2023)   Received from [facility]   Overall Financial Resource Strain (CARDIA)    Difficulty of Paying Living Expenses: Not hard at all  Food Insecurity: No Food Insecurity (05/17/2023)   Received from [facility]   Hunger Vital Sign    Within the past 12 months, you worried that your food would run out before you got the money to buy more.: Never true    Within the past 12 months, the food you bought just didn't last and you didn't have money to get more.: Never true  Transportation Needs: No Transportation Needs  (05/17/2023)   Received from [facility] - Transportation    In the past 12 months, has lack of transportation kept you from medical appointments or from getting medications?: No    Lack of Transportation (Non-Medical): No  Physical Activity: Not on file  Stress: Not on file  Social Connections: Not on file  Intimate Partner Violence: Not on file    No outpatient medications prior to visit.   Facility-Administered Medications Prior to Visit  Medication Dose Route Frequency Provider Last Rate Last Admin   cefTRIAXone  (ROCEPHIN ) injection 500 mg  500 mg Intramuscular Once Shayaan Parke B, PA-C          ROS:  Review of Systems BREAST: No symptoms   OBJECTIVE:   Vitals:  There were no vitals taken for this visit.  Physical Exam  Results: No results found for this or any previous visit (from the past 24 hours).   Assessment/Plan: No diagnosis found.    No orders of the defined types were placed in this encounter.     No follow-ups on file.  Travanti Mcmanus B. Lucious Zou, PA-C 01/01/2024 9:56 AM

## 2024-01-03 ENCOUNTER — Ambulatory Visit: Admitting: Obstetrics and Gynecology

## 2024-01-11 ENCOUNTER — Telehealth: Payer: Self-pay | Admitting: Podiatry

## 2024-01-11 NOTE — Telephone Encounter (Signed)
 LVM to schedule orthotic pick up.. orthotics have been here since 02/2023 and we will be disposing of them by 02/2024 if pt doesn't answer the phone to schedule appt for pu..  Balance: $490.00

## 2024-02-24 ENCOUNTER — Ambulatory Visit

## 2024-03-16 ENCOUNTER — Other Ambulatory Visit: Payer: Self-pay

## 2024-03-16 ENCOUNTER — Ambulatory Visit (INDEPENDENT_AMBULATORY_CARE_PROVIDER_SITE_OTHER)

## 2024-03-16 DIAGNOSIS — Z7189 Other specified counseling: Secondary | ICD-10-CM

## 2024-03-16 DIAGNOSIS — L578 Other skin changes due to chronic exposure to nonionizing radiation: Secondary | ICD-10-CM

## 2024-03-16 DIAGNOSIS — L219 Seborrheic dermatitis, unspecified: Secondary | ICD-10-CM

## 2024-03-16 DIAGNOSIS — D1801 Hemangioma of skin and subcutaneous tissue: Secondary | ICD-10-CM

## 2024-03-16 DIAGNOSIS — L988 Other specified disorders of the skin and subcutaneous tissue: Secondary | ICD-10-CM

## 2024-03-16 DIAGNOSIS — W908XXA Exposure to other nonionizing radiation, initial encounter: Secondary | ICD-10-CM

## 2024-03-16 DIAGNOSIS — L821 Other seborrheic keratosis: Secondary | ICD-10-CM | POA: Diagnosis not present

## 2024-03-16 DIAGNOSIS — L814 Other melanin hyperpigmentation: Secondary | ICD-10-CM | POA: Diagnosis not present

## 2024-03-16 DIAGNOSIS — D229 Melanocytic nevi, unspecified: Secondary | ICD-10-CM

## 2024-03-16 DIAGNOSIS — Z1283 Encounter for screening for malignant neoplasm of skin: Secondary | ICD-10-CM

## 2024-03-16 MED ORDER — TRETINOIN 0.025 % EX CREA: TOPICAL_CREAM | Freq: Every day | CUTANEOUS | 5 refills | Status: DC

## 2024-03-16 NOTE — Progress Notes (Signed)
 ERROR

## 2024-03-16 NOTE — Patient Instructions (Addendum)
 Start tretinoin  0.025% cream in the evening. Educated patient about proper use and potential side effects, including dryness, irritation, sun sensitivity, and transient worsening of acne.  Counseling for BBL / IPL / Laser and Coordination of Care Discussed the treatment option of Broad Band Light (BBL) /Intense Pulsed Light (IPL)/ Laser for skin discoloration, including brown spots and redness.  Typically we recommend at least 1-3 treatment sessions about 5-8 weeks apart for best results.  Cannot have tanned skin when BBL performed, and regular use of sunscreen/photoprotection is advised after the procedure to help maintain results. The patient's condition may also require maintenance treatments in the future.  The fee for BBL / laser treatments is $350 per treatment session for the whole face.  A fee can be quoted for other parts of the body.  Insurance typically does not pay for BBL/laser treatments and therefore the fee is an out-of-pocket cost. Recommend prophylactic valtrex treatment. Once scheduled for procedure, will send Rx in prior to patient's appointment.    Sunscreen  Who needs sunscreen? Everyone. Sunscreen use can help prevent skin cancer by protecting you from the sun's harmful ultraviolet rays. Anyone can get skin cancer, regardless of age, gender or race. In fact, it is estimated that one in five Americans will develop skin cancer in their lifetime.  Sunscreen alone cannot fully protect you. In addition to wearing sunscreen, dermatologists recommend taking the following steps to protect your skin and find skin cancer early:  Seek shade when appropriate, remembering that the sun's rays are strongest between 10 a.m. and 2 p.m. If your shadow is shorter than you are, seek shade. Dress to protect yourself from the sun by wearing a lightweight long-sleeved shirt, pants, a wide-brimmed hat and sunglasses, when possible.  Use extra caution near water, snow and sand as they reflect the  damaging rays of the sun, which can increase your chance of sunburn.  Get vitamin D safely through a healthy diet that may include vitamin supplements. Don't seek the sun. Avoid tanning beds. Ultraviolet light from the sun and tanning beds can cause skin cancer and wrinkling. If you want to look tan, you may wish to use a self-tanning product, but continue to use sunscreen with it.  When should I use sunscreen? Every day you go outside--even if you're just walking to and from your form of transportation. The sun emits harmful UV rays year-round. Even on cloudy days, up to 80 percent of the sun's harmful UV rays can penetrate your skin. Snow, sand and water increase the need for sunscreen because they reflect the sun's rays.  How much sunscreen should I use, and how often should I apply it? Most people only apply 25-50 percent of the recommended amount of sunscreen. Apply enough sunscreen to cover all exposed skin. Most adults need about 1 ounce -- or enough to fill a shot glass -- to fully cover their body.  Don't forget to apply to the tops of your feet, your neck, your ears and the top of your head. Apply sunscreen to dry skin 15 minutes before going outdoors.  Skin cancer also can form on the lips. To protect your lips, apply a lip balm or lipstick that contains sunscreen with an SPF of 30 or higher.  When outdoors, reapply sunscreen approximately every two hours, or after swimming or sweating, according to the directions on the bottle.   Broad-spectrum sunscreens protect against both UVA and UVB rays. What is the difference between the rays? Sunlight consists of  two types of harmful rays that reach the earth -- UVA rays and UVB rays. Overexposure to either can lead to skin cancer. In addition to causing skin cancer, here's what each of these rays do:  UVA rays (or aging rays) can prematurely age your skin, causing wrinkles and age spots, and can pass through window glass. UVB rays (or burning  rays) are the primary cause of sunburn and are blocked by window glass  There is no safe way to tan. Every time you tan, you damage your skin. As this damage builds, you speed up the aging of your skin and increase your risk for all types of skin cancer.  What is the difference between chemical and physical sunscreens? Chemical sunscreens work like a sponge, absorbing the sun's rays. They contain one or more of the following active ingredients: oxybenzone, avobenzone, octisalate, octocrylene, homosalate and octinoxate. These formulations tend to be easier to rub into the skin without leaving a white residue.   Physical sunscreens work like a shield, sitting sit on the surface of your skin and deflecting the sun's rays. They contain the active ingredients zinc oxide and/or titanium dioxide. Use this sunscreen if you have sensitive skin.   What type of sunscreen should I use? The best type of sunscreen is the one you will use again and again. Just make sure it offers broad-spectrum (UVA and UVB) protection, has an SPF of 30+, and is water-resistant. The kind of sunscreen you use is a matter of personal choice, and may vary depending on the area of the body to be protected. Available sunscreen options include lotions, creams, gels, ointments, wax sticks and sprays.  Recommended physical sunscreens for face: - Neutrogena Sheer Zinc - Aveeno Positively Mineral Sensitive - CeraVe Hydrating Mineral (also has a tinted version) - La Roche-Posay Anthelios Mineral Face (comes as a cream, lotion, light fluid, and there is also a tinted version).  - EltaMD UV Clear (also has a tinted version)  Recommended physical sunscreens for body: - Neutrogena Sheer Zinc Dry-Touch Sunscreen Sensitive Skin Lotion Broad Spectrum SPF 50 - Aveeno Positively Mineral Sensitive Skin Sunscreen Broad Spectrum SPF 50 - La Roche-Posay Anthelios SPF 50 Mineral Sunscreen - Gentle Lotion - CeraVe Hydrating Mineral Sunscreen SPF  50  Recommended chemical sunscreens for face: - Anthelios UV Correct Face Sunscreen SPF 70 with Niacinamide - Neutrogena Clear Face Oil-Free SPF 50 with Helioplex - Neutrogena Sport Face Oil-Free SPF 70+ with Helioplex - Aveeno Protect + Hydrate Sunscreen For Face SPF 70 - La Roche-Posay Anthelios Light Fluid Sunscreen for Face SPF 60  Recommended chemical sunscreens for body: - Neutrogena Ultra Sheer Dry-Touch Sunscreen SPF 70 - Aveeno Protect + Hydrate Broad Spectrum All-Day Hydration SPF 60 (comes in a big pump) - La Roche-Posay Anthelios Melt-In Milk Sunscreen SPF 60   Melanoma ABCDEs  Melanoma is the most dangerous type of skin cancer, and is the leading cause of death from skin disease.  You are more likely to develop melanoma if you: Have light-colored skin, light-colored eyes, or red or blond hair Spend a lot of time in the sun Tan regularly, either outdoors or in a tanning bed Have had blistering sunburns, especially during childhood Have a close family member who has had a melanoma Have atypical moles or large birthmarks  Early detection of melanoma is key since treatment is typically straightforward and cure rates are extremely high if we catch it early.   The first sign of melanoma is often a change in  a mole or a new dark spot.  The ABCDE system is a way of remembering the signs of melanoma.  A for asymmetry:  The two halves do not match. B for border:  The edges of the growth are irregular. C for color:  A mixture of colors are present instead of an even brown color. D for diameter:  Melanomas are usually (but not always) greater than 6mm - the size of a pencil eraser. E for evolution:  The spot keeps changing in size, shape, and color.  Please check your skin once per month between visits. You can use a small mirror in front and a large mirror behind you to keep an eye on the back side or your body.   If you see any new or changing lesions before your next  follow-up, please call to schedule a visit.  Please continue daily skin protection including broad spectrum sunscreen SPF 30+ to sun-exposed areas, reapplying every 2 hours as needed when you're outdoors.   Staying in the shade or wearing long sleeves, sun glasses (UVA+UVB protection) and wide brim hats (4-inch brim around the entire circumference of the hat) are also recommended for sun protection.    Due to recent changes in healthcare laws, you may see results of your pathology and/or laboratory studies on MyChart before the doctors have had a chance to review them. We understand that in some cases there may be results that are confusing or concerning to you. Please understand that not all results are received at the same time and often the doctors may need to interpret multiple results in order to provide you with the best plan of care or course of treatment. Therefore, we ask that you please give us  2 business days to thoroughly review all your results before contacting the office for clarification. Should we see a critical lab result, you will be contacted sooner.   If You Need Anything After Your Visit  If you have any questions or concerns for your doctor, please call our main line at [phone] and press option 4 to reach your doctor's medical assistant. If no one answers, please leave a voicemail as directed and we will return your call as soon as possible. Messages left after 4 pm will be answered the following business day.   You may also send us  a message via MyChart. We typically respond to MyChart messages within 1-2 business days.  For prescription refills, please ask your pharmacy to contact our office. Our fax number is [phone].  If you have an urgent issue when the clinic is closed that cannot wait until the next business day, you can page your doctor at the number below.    Please note that while we do our best to be available for urgent issues outside of office hours,  we are not available 24/7.   If you have an urgent issue and are unable to reach us , you may choose to seek medical care at your doctor's office, retail clinic, urgent care center, or emergency room.  If you have a medical emergency, please immediately call 911 or go to the emergency department.  Pager Numbers  - Dr. Hester: [phone]  - Dr. Jackquline: [phone]  - Dr. Claudene: [phone]   In the event of inclement weather, please call our main line at [phone] for an update on the status of any delays or closures.  Dermatology Medication Tips: Please keep the boxes that topical medications come in in order to help keep  track of the instructions about where and how to use these. Pharmacies typically print the medication instructions only on the boxes and not directly on the medication tubes.   If your medication is too expensive, please contact our office at [phone] option 4 or send us  a message through MyChart.   We are unable to tell what your co-pay for medications will be in advance as this is different depending on your insurance coverage. However, we may be able to find a substitute medication at lower cost or fill out paperwork to get insurance to cover a needed medication.   If a prior authorization is required to get your medication covered by your insurance company, please allow us  1-2 business days to complete this process.  Drug prices often vary depending on where the prescription is filled and some pharmacies may offer cheaper prices.  The website www.goodrx.com contains coupons for medications through different pharmacies. The prices here do not account for what the cost may be with help from insurance (it may be cheaper with your insurance), but the website can give you the price if you did not use any insurance.  - You can print the associated coupon and take it with your prescription to the pharmacy.  - You may also stop by our office during regular  business hours and pick up a GoodRx coupon card.  - If you need your prescription sent electronically to a different pharmacy, notify our office through [facility] or by phone at [phone] option 4.     Si Usted Necesita Algo Despus de Su Visita  Tambin puede enviarnos un mensaje a travs de Clinical cytogeneticist. Por lo general respondemos a los mensajes de MyChart en el transcurso de 1 a 2 das hbiles.  Para renovar recetas, por favor pida a su farmacia que se ponga en contacto con nuestra oficina. Randi lakes de fax es [place] [phone].  Si tiene un asunto urgente cuando la clnica est cerrada y que no puede esperar hasta el siguiente da hbil, puede llamar/localizar a su doctor(a) al nmero que aparece a continuacin.   Por favor, tenga en cuenta que aunque hacemos todo lo posible para estar disponibles para asuntos urgentes fuera del horario de [place], no estamos disponibles las 24 horas del da, los 7 [address]  [address] de la [place].   Si tiene un problema urgente y no puede comunicarse con nosotros, puede optar por buscar atencin mdica  en el consultorio de su doctor(a), en una clnica privada, en un centro de atencin urgente o en una sala de emergencias.  Si tiene Engineer, drilling, por favor llame inmediatamente al 911 o vaya a la sala de emergencias.  Nmeros de bper  - Dr. Hester: [phone]  - Dra. Jackquline: [phone]  - Dr. Claudene: [phone]   En caso de inclemencias del tiempo, por favor llame a landry capes principal al [phone] para una actualizacin sobre el [place] de cualquier retraso o cierre.  Consejos para la medicacin en dermatologa: Por favor, guarde las cajas en las que vienen los medicamentos de uso tpico para ayudarle a seguir las instrucciones sobre dnde y cmo usarlos. Las farmacias generalmente imprimen las instrucciones del medicamento slo en las cajas y no directamente en los tubos del [place].   Si su medicamento es muy caro, por  favor, pngase en contacto con landry rieger llamando al [phone] y presione la opcin 4 o envenos un mensaje a travs de Clinical cytogeneticist.   No podemos decirle cul ser su copago por los  medicamentos por adelantado ya que esto es diferente dependiendo de la cobertura de su seguro. Sin embargo, es posible que podamos encontrar un medicamento sustituto a Audiological scientist un formulario para que el seguro cubra el medicamento que se considera necesario.   Si se requiere una autorizacin previa para que su compaa de seguros malta su medicamento, por favor permtanos de 1 a 2 das hbiles para completar este proceso.  Los precios de los medicamentos varan con frecuencia dependiendo del Environmental consultant de dnde se surte la receta y alguna farmacias pueden ofrecer precios ms baratos.  El sitio web www.goodrx.com tiene cupones para medicamentos de Health and safety inspector. Los precios aqu no tienen en cuenta lo que podra costar con la ayuda del seguro (puede ser ms barato con su seguro), pero el sitio web puede darle el precio si no utiliz Tourist information centre manager.  - Puede imprimir el cupn correspondiente y llevarlo con su receta a la farmacia.  - Tambin puede pasar por nuestra oficina durante el horario de atencin regular y Education officer, museum una tarjeta de cupones de GoodRx.  - Si necesita que su receta se enve electrnicamente a una farmacia diferente, informe a nuestra oficina a travs de MyChart de [place] o por telfono llamando al [phone] y presione la opcin 4.

## 2024-03-16 NOTE — Progress Notes (Signed)
 Subjective   Tiffany Bell is a 38 y.o. female who presents for the following: Total body skin exam for skin cancer screening and mole check. The patient has spots, moles and lesions to be evaluated, some may be new or changing and the patient may have concern these could be cancer. Patient is established patient   Today patient reports: No new spots she has noticed.  Would like general recommendations for skin care - has dark and red spots on face.   Review of Systems:    No other skin or systemic complaints except as noted in HPI or Assessment and Plan.  The following portions of the chart were reviewed this encounter and updated as appropriate: medications, allergies, medical history  Relevant Medical History:  Family history of skin cancer - Parents & sister   Objective  Well appearing patient in no apparent distress; mood and affect are within normal limits. Examination was performed of the: Full Skin Examination: scalp, head, eyes, ears, nose, lips, neck, chest, axillae, abdomen, back, buttocks, bilateral upper extremities, bilateral lower extremities, hands, feet, fingers, toes, fingernails, and toenails.   Examination notable for: SKIN EXAM, Angioma(s): Scattered red vascular papule(s)  , Lentigo/lentigines: Scattered pigmented macules that are tan to brown in color and are somewhat non-uniform in shape and concentrated in the sun-exposed areas, Nevus/nevi: Scattered well-demarcated, regular, pigmented macule(s) and/or papule(s)  , Actinic Damage/Elastosis: chronic sun damage: dyspigmentation, telangiectasia, and wrinkling  Telangiectasias of face  - Erythema and scaling of the scalp, ear canals, and central face > rest of face.  Examination limited by: Undergarments and Patient deferred removal       Assessment & Plan   SKIN CANCER SCREENING PERFORMED TODAY.  BENIGN SKIN FINDINGS  - Lentigines  - Seborrheic keratoses  - Hemangiomas   - Nevus/Multiple Benign Nevi    -Telangiectasias  - Reassurance provided regarding the benign appearance of lesions noted on exam today; no treatment is indicated in the absence of symptoms/changes. - Reinforced importance of photoprotective strategies including liberal and frequent sunscreen use of a broad-spectrum SPF 30 or greater, use of protective clothing, and sun avoidance for prevention of cutaneous malignancy and photoaging.  Counseled patient on the importance of regular self-skin monitoring as well as routine clinical skin examinations as scheduled.   ACTINIC DAMAGE - Chronic condition, secondary to cumulative UV/sun exposure - Recommend daily broad spectrum sunscreen SPF 30+ to sun-exposed areas, reapply every 2 hours as needed.  - Staying in the shade or wearing long sleeves, sun glasses (UVA+UVB protection) and wide brim hats (4-inch brim around the entire circumference of the hat) are also recommended for sun protection.  - Call for new or changing lesions.  Seborrheic dermatitis Chronic and persistent condition with duration or expected duration over one year. Condition is symptomatic and bothersome to patient. Patient is flaring and not currently at treatment goal.  - Discussed diagnosis, typical course, and treatment options for this condition - Explained to the patient the chronic nature of this diagnosis - Patient deferred treatment.  - Consider alternating with use of Head and Shoulders or Selsun Blue anti-dandruff shampoo which contains zinc pyrithione 1%  FACIAL ELASTOSIS, Telangiectasias/hyperpigmentation, rhytides   Treatment Plan: Start tretinoin  0.025% cream in the evening. Educated patient about proper use and potential side effects, including dryness, irritation, sun sensitivity, and transient worsening of acne. Discussed lasers, fillers, botox   Counseling for BBL / IPL / Laser and Coordination of Care Discussed the treatment option of Broad  Band Light (BBL) /Intense Pulsed Light (IPL)/  Laser for skin discoloration, including brown spots and redness.  Typically we recommend at least 1-3 treatment sessions about 5-8 weeks apart for best results.  Cannot have tanned skin when BBL performed, and regular use of sunscreen/photoprotection is advised after the procedure to help maintain results. The patient's condition may also require maintenance treatments in the future.  The fee for BBL / laser treatments is $350 per treatment session for the whole face.  A fee can be quoted for other parts of the body.  Insurance typically does not pay for BBL/laser treatments and therefore the fee is an out-of-pocket cost. Recommend prophylactic valtrex treatment. Once scheduled for procedure, will send Rx in prior to patient's appointment.    Recommend daily broad spectrum sunscreen SPF 30+ to sun-exposed areas, reapply every 2 hours as needed. Call for new or changing lesions.  Staying in the shade or wearing long sleeves, sun glasses (UVA+UVB protection) and wide brim hats (4-inch brim around the entire circumference of the hat) are also recommended for sun protection.    Level of service outlined above   Procedures, orders, diagnosis for this visit:    There are no diagnoses linked to this encounter.  Return to clinic: Return in about 1 year (around 03/16/2025) for TBSE.  Documentation:   I, Jacquelynn V. Wilfred, CMA, am acting as scribe for Lauraine JAYSON Kanaris, MD .  I have reviewed the above documentation for accuracy and completeness, and I agree with the above.  Lauraine JAYSON Kanaris, MD

## 2024-06-12 ENCOUNTER — Telehealth: Payer: Self-pay

## 2024-06-12 ENCOUNTER — Other Ambulatory Visit: Payer: Self-pay

## 2024-06-12 MED ORDER — TRETINOIN 0.025 % EX GEL: Freq: Every day | CUTANEOUS | 5 refills | Status: AC

## 2024-06-12 NOTE — Telephone Encounter (Signed)
 Tretinoin  cream and gel not covered by insurance. Please advise.

## 2024-06-12 NOTE — Progress Notes (Signed)
 Pharmacy states that Tretinoin  gel is preferred by insurance.

## 2025-03-22 ENCOUNTER — Ambulatory Visit
